# Patient Record
Sex: Female | Born: 1985 | Race: Black or African American | Hispanic: No | Marital: Married | Smoking: Never smoker
Health system: Southern US, Community
[De-identification: ages and names within clinical notes are randomized; demographics above are authoritative.]

## PROBLEM LIST (undated history)

## (undated) DIAGNOSIS — E282 Polycystic ovarian syndrome: Secondary | ICD-10-CM

## (undated) DIAGNOSIS — Z9889 Other specified postprocedural states: Secondary | ICD-10-CM

## (undated) HISTORY — DX: Other specified postprocedural states: Z98.890

## (undated) HISTORY — DX: Polycystic ovarian syndrome: E28.2

## (undated) HISTORY — PX: BREAST SURGERY: SHX581

## (undated) HISTORY — PX: AUGMENTATION MAMMAPLASTY: SUR837

---

## 2019-06-04 ENCOUNTER — Emergency Department (HOSPITAL_COMMUNITY)
Admission: EM | Admit: 2019-06-04 | Discharge: 2019-06-04 | Disposition: A | Discharge status: Home / Self Care | Payer: BC Managed Care – PPO | Attending: Emergency Medicine | Admitting: Emergency Medicine

## 2019-06-04 ENCOUNTER — Encounter (HOSPITAL_COMMUNITY): Payer: Self-pay | Admitting: Emergency Medicine

## 2019-06-04 ENCOUNTER — Other Ambulatory Visit: Payer: Self-pay

## 2019-06-04 DIAGNOSIS — E876 Hypokalemia: Secondary | ICD-10-CM | POA: Insufficient documentation

## 2019-06-04 DIAGNOSIS — R1030 Lower abdominal pain, unspecified: Secondary | ICD-10-CM | POA: Diagnosis not present

## 2019-06-04 DIAGNOSIS — Z3A14 14 weeks gestation of pregnancy: Secondary | ICD-10-CM | POA: Diagnosis not present

## 2019-06-04 DIAGNOSIS — R109 Unspecified abdominal pain: Secondary | ICD-10-CM

## 2019-06-04 DIAGNOSIS — O26892 Other specified pregnancy related conditions, second trimester: Secondary | ICD-10-CM | POA: Diagnosis not present

## 2019-06-04 LAB — CBC
HCT: 37.3 % (ref 36.0–46.0)
Hemoglobin: 12.3 g/dL (ref 12.0–15.0)
MCH: 29.8 pg (ref 26.0–34.0)
MCHC: 33 g/dL (ref 30.0–36.0)
MCV: 90.3 fL (ref 80.0–100.0)
Platelets: 358 10*3/uL (ref 150–400)
RBC: 4.13 MIL/uL (ref 3.87–5.11)
RDW: 12.4 % (ref 11.5–15.5)
WBC: 10.5 10*3/uL (ref 4.0–10.5)
nRBC: 0 % (ref 0.0–0.2)

## 2019-06-04 LAB — COMPREHENSIVE METABOLIC PANEL
ALT: 10 U/L (ref 0–44)
AST: 15 U/L (ref 15–41)
Albumin: 3.6 g/dL (ref 3.5–5.0)
Alkaline Phosphatase: 66 U/L (ref 38–126)
Anion gap: 8 (ref 5–15)
BUN: 10 mg/dL (ref 6–20)
CO2: 24 mmol/L (ref 22–32)
Calcium: 9.1 mg/dL (ref 8.9–10.3)
Chloride: 103 mmol/L (ref 98–111)
Creatinine, Ser: 0.69 mg/dL (ref 0.44–1.00)
GFR calc Af Amer: >60 mL/min (ref >60)
GFR calc non Af Amer: >60 mL/min (ref >60)
Glucose, Bld: 90 mg/dL (ref 70–99)
Potassium: 3.4 mmol/L — ABNORMAL LOW (ref 3.5–5.1)
Sodium: 135 mmol/L (ref 135–145)
Total Bilirubin: 0.3 mg/dL (ref 0.3–1.2)
Total Protein: 7.5 g/dL (ref 6.5–8.1)

## 2019-06-04 LAB — URINALYSIS, ROUTINE W REFLEX MICROSCOPIC
Bilirubin Urine: NEGATIVE
Glucose, UA: NEGATIVE mg/dL
Hgb urine dipstick: NEGATIVE
Ketones, ur: NEGATIVE mg/dL
Leukocytes,Ua: NEGATIVE
Nitrite: NEGATIVE
Protein, ur: NEGATIVE mg/dL
Specific Gravity, Urine: 1.025 (ref 1.005–1.030)
pH: 6 (ref 5.0–8.0)

## 2019-06-04 LAB — LIPASE, BLOOD: Lipase: 28 U/L (ref 11–51)

## 2019-06-04 LAB — HCG, QUANTITATIVE, PREGNANCY: hCG, Beta Chain, Quant, S: 59436 m[IU]/mL — ABNORMAL HIGH (ref <5)

## 2019-06-04 MED ORDER — POTASSIUM CHLORIDE CRYS ER 20 MEQ PO TBCR
40.0000 meq | EXTENDED_RELEASE_TABLET | Freq: Once | ORAL | Status: AC
  Administered 2019-06-04: 19:00:00 40 meq via ORAL
  Filled 2019-06-04: qty 2

## 2019-06-04 NOTE — ED Provider Notes (Signed)
[facility] DEPT Provider Note   CSN: [id number] Arrival date & time: 06/04/19  1652    History   Chief Complaint Chief Complaint  Patient presents with   Abdominal Pain    HPI Angelica Farmer is a 33 y.o. G4P3 approximately [redacted] weeks gestation pregnant female presenting for evaluation of acute onset, persistent lower abdominal pressure since this morning.  She reports mild pressure sensation which she reports is "not really pain "to the suprapubic region but radiating bilaterally.  Pain worsens with ambulation, improves with laying on her right side.  She denies urinary symptoms, vaginal itching, bleeding, discharge, diarrhea, constipation, nausea, vomiting, fever, shortness of breath, chest pain.  No known sick contacts, no suspicious food intake.  Has not tried anything for her symptoms.  She recently relocated here from [place] and reports she has an appointment to see an OB/GYN here next month.  She does report that she had an ultrasound a few weeks ago which showed an IUP and a right sided ovarian cyst of unknown size to the patient which the patient states she was told would resolve on its own.     The history is provided by the patient.    No past medical history on file.  There are no active problems to display for this patient.     OB History    Gravida  4   Para   3   Term   3   Preterm      AB      Living   3     SAB      TAB      Ectopic      Multiple      Live Births               Home Medications    Prior to Admission medications   Not on File    Family History No family history on file.  Social History Social History   Tobacco Use   Smoking status: Not on file  Substance Use Topics   Alcohol use: Not on file   Drug use: Not on file     Allergies   Patient has no known allergies.   Review of Systems Review of Systems  Constitutional: Negative for chills and fever.  Respiratory:  Negative for shortness of breath.   Cardiovascular: Negative for chest pain.  Gastrointestinal: Positive for abdominal pain. Negative for nausea and vomiting.  Genitourinary: Positive for pelvic pain. Negative for decreased urine volume, dysuria, frequency, hematuria, urgency, vaginal bleeding, vaginal discharge and vaginal pain.     Physical Exam Updated Vital Signs BP 124/80 (BP Location: Left Arm)    Pulse 83    Temp 98.9 F (37.2 C) (Oral)    Resp 16    Ht 5\' 4"  (1.626 m)    Wt 71.2 kg    LMP 02/25/2019    SpO2 98%    BMI 26.95 kg/m   Physical Exam Vitals signs and nursing note reviewed.  Constitutional:      General: She is not in acute distress.    Appearance: She is well-developed.     Comments: Resting comfortably in bed  HENT:     Head: Normocephalic and atraumatic.  Eyes:     General:        Right eye: No discharge.        Left eye: No discharge.     Conjunctiva/sclera: Conjunctivae normal.  Neck:  Vascular: No JVD.     Trachea: No tracheal deviation.  Cardiovascular:     Rate and Rhythm: Normal rate and regular rhythm.  Pulmonary:     Effort: Pulmonary effort is normal.     Breath sounds: Normal breath sounds.  Abdominal:     General: Abdomen is flat. Bowel sounds are normal. There is no distension.     Palpations: Abdomen is soft.     Tenderness: There is abdominal tenderness in the suprapubic area. There is no right CVA tenderness, left CVA tenderness, guarding or rebound. Negative signs include Murphy's sign and Rovsing's sign.     Comments: Uterine fundus past the pelvic brim by ~2cm  Genitourinary:    Comments: deferred Skin:    General: Skin is warm and dry.     Findings: No erythema.  Neurological:     Mental Status: She is alert.  Psychiatric:        Behavior: Behavior normal.      ED Treatments / Results  Labs (all labs ordered are listed, but only abnormal results are displayed) Labs Reviewed  COMPREHENSIVE METABOLIC PANEL - Abnormal;  Notable for the following components:      Result Value   Potassium 3.4 (*)    All other components within normal limits  HCG, QUANTITATIVE, PREGNANCY - Abnormal; Notable for the following components:   hCG, Beta Chain, Quant, S [id number] (*)    All other components within normal limits  LIPASE, BLOOD  CBC  URINALYSIS, ROUTINE W REFLEX MICROSCOPIC    EKG None  Radiology No results found.  Procedures Procedures (including critical care time)  Medications Ordered in ED Medications - No data to display   Initial Impression / Assessment and Plan / ED Course  I have reviewed the triage vital signs and the nursing notes.  Pertinent labs & imaging results that were available during my care of the patient were reviewed by me and considered in my medical decision making (see chart for details).        Patient with reported IUP (per the patient) approximately [redacted] weeks along presents for evaluation of mild pelvic pain.  Patient is afebrile, vital signs are stable.  She is nontoxic in appearance.  Abdominal examination is reassuring with no peritoneal signs.  She has suprapubic pain but her UA is unremarkable.  The remainder of her blood work reviewed by me shows no leukocytosis, no anemia, no metabolic derangements, no renal insufficiency.  She is very mildly hypokalemic, replenished orally in the ED.  She has no other associated symptoms.  We had a long discussion regarding the utility of a pelvic ultrasound in the ED and a shared decision-making conversation was had.  The patient and her significant other would like to hold off on any additional imaging at this time which I think is reasonable given she is overall very well-appearing, has no concerning signs or symptoms of placenta previa/abruptio, preeclampsia, PID, ovarian torsion, or ectopic pregnancy.  She has no unilateral abdominal tenderness or pain on examination which is also reassuring and I doubt that her ovarian cyst on prior  ultrasound is contributing to her symptoms as these do generally resolve on their own over the course of a couple of months and she has no unilateral pain.  Her quantitative hCG is appropriately elevated.  He does have follow-up with an OB/GYN scheduled here.  Consider the possibility of round ligament syndrome as well.  Recommend Tylenol, application of heat as needed for pain.  Recommend  close OB/GYN follow-up.  We discussed that [facility] if she has any concerning signs or symptoms and we discussed strict ED return precautions.  Patient and her significant other verbalized understanding of and agreement with plan and patient stable for discharge home at this time. Discussed with Dr. Rodena MedinMessick who agrees with assessment and plan at this time.    Final Clinical Impressions(s) / ED Diagnoses   Final diagnoses:  Abdominal pain during pregnancy in second trimester    ED Discharge Orders    None       Bennye AlmFawze, Lamont Glasscock A, PA-C 06/04/19 1902    Wynetta FinesMessick, Peter C, MD 06/05/19 1447

## 2019-06-04 NOTE — ED Triage Notes (Signed)
Pt c/op lower abd pressure since last night. Denies n/v/d or urinary problems.

## 2019-06-04 NOTE — Discharge Instructions (Signed)
You can take 1 to 2 tablets of Tylenol every 6 hours as needed for pain.  Do not exceed 4000 mg of Tylenol daily.  You can apply heating pad 20 minutes at a time a few times a day for pain or take warm soaks.  See attached information for pain management and other recommendations to help ease the pressure in your abdomen.  The rest of your blood work was very reassuring.  Follow-up with your OB/GYN as scheduled or you can call to move your appointment up.  If you have any concerning signs or symptoms such as worsening pain, persistent vomiting, high fevers, vaginal bleeding then go to the [facility] where there are OB/GYN specialist that can better evaluate you.

## 2019-11-11 ENCOUNTER — Emergency Department (HOSPITAL_COMMUNITY)
Admission: EM | Admit: 2019-11-11 | Discharge: 2019-11-12 | Disposition: A | Discharge status: Home / Self Care | Payer: Self-pay | Attending: Emergency Medicine | Admitting: Emergency Medicine

## 2019-11-11 ENCOUNTER — Encounter (HOSPITAL_COMMUNITY): Payer: Self-pay

## 2019-11-11 ENCOUNTER — Other Ambulatory Visit: Payer: Self-pay

## 2019-11-11 DIAGNOSIS — Z3A01 Less than 8 weeks gestation of pregnancy: Secondary | ICD-10-CM | POA: Insufficient documentation

## 2019-11-11 DIAGNOSIS — Z3490 Encounter for supervision of normal pregnancy, unspecified, unspecified trimester: Secondary | ICD-10-CM

## 2019-11-11 DIAGNOSIS — Z79899 Other long term (current) drug therapy: Secondary | ICD-10-CM | POA: Insufficient documentation

## 2019-11-11 DIAGNOSIS — O3481 Maternal care for other abnormalities of pelvic organs, first trimester: Secondary | ICD-10-CM | POA: Insufficient documentation

## 2019-11-11 DIAGNOSIS — N83201 Unspecified ovarian cyst, right side: Secondary | ICD-10-CM | POA: Insufficient documentation

## 2019-11-11 DIAGNOSIS — O4691 Antepartum hemorrhage, unspecified, first trimester: Secondary | ICD-10-CM | POA: Insufficient documentation

## 2019-11-11 DIAGNOSIS — N939 Abnormal uterine and vaginal bleeding, unspecified: Secondary | ICD-10-CM

## 2019-11-11 LAB — WET PREP, GENITAL
Clue Cells Wet Prep HPF POC: NONE SEEN
Sperm: NONE SEEN
Trich, Wet Prep: NONE SEEN
Yeast Wet Prep HPF POC: NONE SEEN

## 2019-11-11 LAB — PREGNANCY, URINE: Preg Test, Ur: POSITIVE — AB

## 2019-11-11 NOTE — ED Triage Notes (Signed)
Patient arrived stating she found out she was pregnant last week. Reports that she began spotting today. Reports irregular periods and unknown how far along she is. Denies any significant bleeding, clots, or cramping.
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
## 2019-11-12 ENCOUNTER — Emergency Department (HOSPITAL_COMMUNITY): Payer: Self-pay

## 2019-11-12 LAB — HCG, QUANTITATIVE, PREGNANCY: hCG, Beta Chain, Quant, S: 7863 m[IU]/mL — ABNORMAL HIGH (ref <5)

## 2019-11-12 LAB — ABO/RH: ABO/RH(D): A POS

## 2019-11-12 NOTE — ED Provider Notes (Signed)
2:10 AM Patient's ultrasound reviewed.  This shows single viable IUP of approximately [redacted]w[redacted]d.  Fetal heart rate 109bpm.  Complex R ovarian cyst also noted, though patient without c/o abdominal pain.  Stable for follow up with outpatient OB for ongoing prenatal care. Will refer to [facility]. Return precautions discussed and provided. Patient discharged in stable condition with no unaddressed concerns.   Results for orders placed or performed during the hospital encounter of 11/11/19  Wet prep, genital  Result Value Ref Range   Yeast Wet Prep HPF POC NONE SEEN NONE SEEN   Trich, Wet Prep NONE SEEN NONE SEEN   Clue Cells Wet Prep HPF POC NONE SEEN NONE SEEN   WBC, Wet Prep HPF POC FEW (A) NONE SEEN   Sperm NONE SEEN   Pregnancy, urine  Result Value Ref Range   Preg Test, Ur POSITIVE (A) NEGATIVE  hCG, quantitative, pregnancy  Result Value Ref Range   hCG, Beta Chain, Quant, S 7,863 (H) <5 mIU/mL  ABO/Rh  Result Value Ref Range   ABO/RH(D)      A POS Performed at [facility], [address], [place], [place] [address]    US OB LESS THAN 14 WEEKS WITH OB TRANSVAGINAL  Result Date: 11/12/2019 CLINICAL DATA:  Initial evaluation for acute vaginal bleeding, early pregnancy. EXAM: OBSTETRIC <14 WK [place] AND TRANSVAGINAL OB US TECHNIQUE: Both transabdominal and transvaginal ultrasound examinations were performed for complete evaluation of the gestation as well as the maternal uterus, adnexal regions, and pelvic cul-de-sac. Transvaginal technique was performed to assess early pregnancy. COMPARISON:  None. FINDINGS: Intrauterine gestational sac: Single Yolk sac:  Present Embryo:  Present Cardiac Activity: Present Heart Rate: 109 bpm CRL: 2.6 mm   5 w   6 d                  [place] EDC: 07/09/2020 Subchorionic hemorrhage:  None visualized. Maternal uterus/adnexae: Left ovary within normal limits. 3.4 x 3.4 x 4.7 cm complex cyst seen within the right ovary, likely a hemorrhagic cyst with  internal retractile clot. No internal vascularity. No free fluid within the pelvis. IMPRESSION: 1. Single viable intrauterine pregnancy as above, estimated gestational age [redacted] weeks and 6 days by crown-rump length, with ultrasound EDC of 07/09/2020. No complication. 2. 4.7 cm complex right ovarian cyst, likely a hemorrhagic cyst with internal retractile clot. Attention at follow-up recommended. 3. No other acute maternal uterine or adnexal abnormality identified. Electronically Signed   By: Rise Mu M.D.   On: 11/12/2019 01:05      Antony Madura, Cordelia Poche 11/12/19 Otelia Santee, April, MD 11/12/19 0350

## 2019-11-12 NOTE — Discharge Instructions (Signed)
Your ultrasound revealed an intrauterine pregnancy of approximately 5 weeks, 6 days.  If you have not already, start taking a prenatal vitamin daily.  Call and schedule routine prenatal follow-up with an OB/GYN.  Avoid NSAIDs such as ibuprofen or Aleve in pregnancy.  You may take Tylenol for any discomfort.  Should you develop any new or concerning symptoms associated with pregnancy, present to [facility] for further evaluation.

## 2019-12-30 ENCOUNTER — Telehealth: Payer: Self-pay | Admitting: Family Medicine

## 2019-12-30 NOTE — Telephone Encounter (Signed)
Attempted to contact patient to get her scheduled for her new ob intake appointment. No answer, left voicemail w/ appointment information 01/27/20 @ 3:30. Patient instructed that this appointment is a telephone visit. Patient instructed to give the office a call back if she needs to reschedule the appointment. MyChart message sent

## 2020-01-27 ENCOUNTER — Ambulatory Visit (INDEPENDENT_AMBULATORY_CARE_PROVIDER_SITE_OTHER): Payer: Medicaid Other | Admitting: *Deleted

## 2020-01-27 ENCOUNTER — Other Ambulatory Visit: Payer: Self-pay

## 2020-01-27 DIAGNOSIS — Z3480 Encounter for supervision of other normal pregnancy, unspecified trimester: Secondary | ICD-10-CM

## 2020-01-27 NOTE — Progress Notes (Signed)
3:30 I called Choua for her telephone visit for her new ob intake.  I left a message I was calling for her telephone visit and will call again in a few minutes; please be available to answer so we can complete your visit. Nashalie Sallis,RN  3:37 I called Jasiya again and left a message I am calling again for your telephone visit and since I did not reach you- it will need to be rescheduled. Please call our office to reschedule. I will try your contact number ; but if I do not reach you- please call our office to reschedule your telephone visit. I called her contact number and asked for Daniyah - she was not there .  I asked if they would give her the message that we were trying to reach her about her appointment and to call us. A female took the message and phone number for Kynsleigh to call us back. Shahrzad Koble,RN

## 2020-01-28 ENCOUNTER — Encounter: Payer: Self-pay | Admitting: Nurse Practitioner

## 2020-02-17 ENCOUNTER — Other Ambulatory Visit: Payer: Self-pay

## 2020-02-19 ENCOUNTER — Ambulatory Visit (INDEPENDENT_AMBULATORY_CARE_PROVIDER_SITE_OTHER): Payer: Medicaid Other | Admitting: *Deleted

## 2020-02-19 DIAGNOSIS — Z3490 Encounter for supervision of normal pregnancy, unspecified, unspecified trimester: Secondary | ICD-10-CM

## 2020-02-19 NOTE — Progress Notes (Signed)
I connected with  Zamirah Deroos on 02/19/20 at  8:30 AM EDT by telephone and verified that I am speaking with the correct person using two identifiers.   I discussed the limitations, risks, security and privacy concerns of performing an evaluation and management service by telephone and the availability of in person appointments. I also discussed with the patient that there may be a patient responsible charge related to this service. The patient expressed understanding and agreed to proceed.   She informed me she has started care with [facility] with Nigel Bridgeman, CNM.  She confirmed she would like her appointments cancelled with our office.  Aparna Vanderweele,RN 02/19/2020  8:39 AM

## 2020-02-20 ENCOUNTER — Encounter: Payer: Self-pay | Admitting: Obstetrics and Gynecology

## 2020-02-27 ENCOUNTER — Other Ambulatory Visit: Payer: Self-pay | Admitting: Obstetrics and Gynecology

## 2020-02-27 DIAGNOSIS — Z3A22 22 weeks gestation of pregnancy: Secondary | ICD-10-CM

## 2020-02-27 DIAGNOSIS — Z363 Encounter for antenatal screening for malformations: Secondary | ICD-10-CM

## 2020-02-27 DIAGNOSIS — O280 Abnormal hematological finding on antenatal screening of mother: Secondary | ICD-10-CM

## 2020-03-05 ENCOUNTER — Other Ambulatory Visit: Payer: Self-pay | Admitting: *Deleted

## 2020-03-05 ENCOUNTER — Ambulatory Visit: Payer: Medicaid Other | Attending: Obstetrics and Gynecology

## 2020-03-05 ENCOUNTER — Other Ambulatory Visit: Payer: Self-pay

## 2020-03-05 ENCOUNTER — Other Ambulatory Visit: Payer: Self-pay | Admitting: Obstetrics and Gynecology

## 2020-03-05 ENCOUNTER — Ambulatory Visit: Payer: Medicaid Other | Admitting: *Deleted

## 2020-03-05 ENCOUNTER — Ambulatory Visit: Payer: Self-pay

## 2020-03-05 ENCOUNTER — Ambulatory Visit (HOSPITAL_BASED_OUTPATIENT_CLINIC_OR_DEPARTMENT_OTHER): Payer: Medicaid Other | Admitting: Genetic Counselor

## 2020-03-05 VITALS — BP 111/70 | HR 87 | Wt 191.0 lb

## 2020-03-05 DIAGNOSIS — O09292 Supervision of pregnancy with other poor reproductive or obstetric history, second trimester: Secondary | ICD-10-CM

## 2020-03-05 DIAGNOSIS — O280 Abnormal hematological finding on antenatal screening of mother: Secondary | ICD-10-CM

## 2020-03-05 DIAGNOSIS — Z315 Encounter for genetic counseling: Secondary | ICD-10-CM | POA: Diagnosis not present

## 2020-03-05 DIAGNOSIS — Z363 Encounter for antenatal screening for malformations: Secondary | ICD-10-CM | POA: Diagnosis present

## 2020-03-05 DIAGNOSIS — O09212 Supervision of pregnancy with history of pre-term labor, second trimester: Secondary | ICD-10-CM | POA: Diagnosis not present

## 2020-03-05 DIAGNOSIS — O285 Abnormal chromosomal and genetic finding on antenatal screening of mother: Secondary | ICD-10-CM

## 2020-03-05 DIAGNOSIS — O321XX0 Maternal care for breech presentation, not applicable or unspecified: Secondary | ICD-10-CM

## 2020-03-05 DIAGNOSIS — Z3A22 22 weeks gestation of pregnancy: Secondary | ICD-10-CM

## 2020-03-05 DIAGNOSIS — O0990 Supervision of high risk pregnancy, unspecified, unspecified trimester: Secondary | ICD-10-CM | POA: Diagnosis present

## 2020-03-05 DIAGNOSIS — O09213 Supervision of pregnancy with history of pre-term labor, third trimester: Secondary | ICD-10-CM

## 2020-03-05 DIAGNOSIS — O34219 Maternal care for unspecified type scar from previous cesarean delivery: Secondary | ICD-10-CM | POA: Diagnosis not present
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
## 2020-03-17 ENCOUNTER — Ambulatory Visit: Payer: Medicaid Other | Attending: Obstetrics and Gynecology

## 2020-03-18 ENCOUNTER — Other Ambulatory Visit: Payer: Self-pay

## 2020-03-18 ENCOUNTER — Ambulatory Visit: Payer: Medicaid Other | Attending: Obstetrics and Gynecology

## 2020-03-18 ENCOUNTER — Other Ambulatory Visit: Payer: Medicaid Other

## 2020-03-18 ENCOUNTER — Other Ambulatory Visit: Payer: Self-pay | Admitting: Obstetrics and Gynecology

## 2020-03-18 ENCOUNTER — Ambulatory Visit: Payer: Medicaid Other | Admitting: *Deleted

## 2020-03-18 DIAGNOSIS — Z8759 Personal history of other complications of pregnancy, childbirth and the puerperium: Secondary | ICD-10-CM

## 2020-03-18 NOTE — Progress Notes (Signed)
d 

## 2020-03-27 ENCOUNTER — Telehealth: Payer: Self-pay | Admitting: Genetic Counselor

## 2020-03-27 NOTE — Telephone Encounter (Signed)
I received an email from Ms. Silvester yesterday inquiring if her repeat Panorama noninvasive prenatal screening (NIPS) results were available yet. Ms. Arai had a sample redrawn for Panorama NIPS on 03/18/20 as her initial results were high-risk for trisomy 51, trisomy 33, and triploidy due to low fetal fraction (see Genetic Counseling note from 5/20). I responded to Ms. Jumper's email yesterday informing her that her results were not yet available.  This morning I heard from representative Mardella Layman from [place] that Ms. Abernathy's results were returned and are low-risk. I attempted to call Ms. Robinson, but there was no answer and her voicemail was full so I could not leave a message. Instead, I emailed Ms. Preis to discuss her negative NIPS result.  Ms. Mcduffey's negative results demonstrated an expected representation of chromosome 68, 31, 44, and sex chromosome material, greatly reducing the likelihood of trisomies 42, 76, or 68, triploidy, and sex chromosome aneuploidies for the pregnancy. I reminded Ms. Dipiero that NIPS is considered to be highly specific and sensitive, but is not considered to be a diagnostic test. Panorama NIPS identifies 94-99% of pregnancies with trisomies 21, 13, and 18, triploidy, and sex chromosome abnormalities, but does not test for all genetic conditions. Ms. White was reminded that diagnostic testing via amniocentesis is available should she be interested in confirming this result. I encouraged Ms. Tetro to contact me via phone or email if she has any questions about her results.  Gershon Crane, MS, [facility] Genetic Counselor

## 2020-04-16 ENCOUNTER — Ambulatory Visit: Payer: Medicaid Other | Admitting: *Deleted

## 2020-04-16 ENCOUNTER — Ambulatory Visit: Payer: Medicaid Other | Attending: Obstetrics and Gynecology

## 2020-04-16 ENCOUNTER — Other Ambulatory Visit: Payer: Self-pay

## 2020-04-16 VITALS — BP 119/78 | HR 124

## 2020-04-16 DIAGNOSIS — O09213 Supervision of pregnancy with history of pre-term labor, third trimester: Secondary | ICD-10-CM | POA: Diagnosis present

## 2020-04-16 DIAGNOSIS — O4403 Placenta previa specified as without hemorrhage, third trimester: Secondary | ICD-10-CM

## 2020-04-16 DIAGNOSIS — Z3A28 28 weeks gestation of pregnancy: Secondary | ICD-10-CM

## 2020-04-16 DIAGNOSIS — O0990 Supervision of high risk pregnancy, unspecified, unspecified trimester: Secondary | ICD-10-CM

## 2020-04-16 DIAGNOSIS — O09293 Supervision of pregnancy with other poor reproductive or obstetric history, third trimester: Secondary | ICD-10-CM

## 2020-04-16 DIAGNOSIS — O34219 Maternal care for unspecified type scar from previous cesarean delivery: Secondary | ICD-10-CM | POA: Diagnosis not present

## 2020-04-16 DIAGNOSIS — O289 Unspecified abnormal findings on antenatal screening of mother: Secondary | ICD-10-CM | POA: Diagnosis not present

## 2020-04-29 ENCOUNTER — Encounter (HOSPITAL_COMMUNITY): Payer: Self-pay | Admitting: Obstetrics and Gynecology

## 2020-04-29 ENCOUNTER — Other Ambulatory Visit: Payer: Self-pay

## 2020-04-29 ENCOUNTER — Inpatient Hospital Stay (HOSPITAL_COMMUNITY)
Admission: EM | Admit: 2020-04-29 | Discharge: 2020-04-29 | Disposition: A | Discharge status: Home / Self Care | Payer: Medicaid Other | Attending: Obstetrics and Gynecology | Admitting: Obstetrics and Gynecology

## 2020-04-29 DIAGNOSIS — O4403 Placenta previa specified as without hemorrhage, third trimester: Secondary | ICD-10-CM | POA: Insufficient documentation

## 2020-04-29 DIAGNOSIS — Z3A30 30 weeks gestation of pregnancy: Secondary | ICD-10-CM | POA: Insufficient documentation

## 2020-04-29 DIAGNOSIS — O99283 Endocrine, nutritional and metabolic diseases complicating pregnancy, third trimester: Secondary | ICD-10-CM | POA: Diagnosis not present

## 2020-04-29 DIAGNOSIS — O368130 Decreased fetal movements, third trimester, not applicable or unspecified: Secondary | ICD-10-CM

## 2020-04-29 DIAGNOSIS — O368131 Decreased fetal movements, third trimester, fetus 1: Secondary | ICD-10-CM | POA: Diagnosis not present

## 2020-04-29 DIAGNOSIS — O368191 Decreased fetal movements, unspecified trimester, fetus 1: Secondary | ICD-10-CM

## 2020-04-29 DIAGNOSIS — O30003 Twin pregnancy, unspecified number of placenta and unspecified number of amniotic sacs, third trimester: Secondary | ICD-10-CM | POA: Diagnosis not present

## 2020-04-29 DIAGNOSIS — E282 Polycystic ovarian syndrome: Secondary | ICD-10-CM | POA: Diagnosis not present

## 2020-04-29 DIAGNOSIS — Z3689 Encounter for other specified antenatal screening: Secondary | ICD-10-CM

## 2020-04-29 NOTE — Discharge Instructions (Signed)
Fetal Movement Counts Patient Name: ________________________________________________ Patient Due Date: ____________________ What is a fetal movement count?  A fetal movement count is the number of times that you feel your baby move during a certain amount of time. This may also be called a fetal kick count. A fetal movement count is recommended for every pregnant woman. You may be asked to start counting fetal movements as early as week 28 of your pregnancy. Pay attention to when your baby is most active. You may notice your baby's sleep and wake cycles. You may also notice things that make your baby move more. You should do a fetal movement count:  When your baby is normally most active.  At the same time each day. A good time to count movements is while you are resting, after having something to eat and drink. How do I count fetal movements? 1. Find a quiet, comfortable area. Sit, or lie down on your side. 2. Write down the date, the start time and stop time, and the number of movements that you felt between those two times. Take this information with you to your health care visits. 3. Write down your start time when you feel the first movement. 4. Count kicks, flutters, swishes, rolls, and jabs. You should feel at least 10 movements. 5. You may stop counting after you have felt 10 movements, or if you have been counting for 2 hours. Write down the stop time. 6. If you do not feel 10 movements in 2 hours, contact your health care provider for further instructions. Your health care provider may want to do additional tests to assess your baby's well-being. Contact a health care provider if:  You feel fewer than 10 movements in 2 hours.  Your baby is not moving like he or she usually does. Date: ____________ Start time: ____________ Stop time: ____________ Movements: ____________ Date: ____________ Start time: ____________ Stop time: ____________ Movements: ____________ Date: ____________  Start time: ____________ Stop time: ____________ Movements: ____________ Date: ____________ Start time: ____________ Stop time: ____________ Movements: ____________ Date: ____________ Start time: ____________ Stop time: ____________ Movements: ____________ Date: ____________ Start time: ____________ Stop time: ____________ Movements: ____________ Date: ____________ Start time: ____________ Stop time: ____________ Movements: ____________ Date: ____________ Start time: ____________ Stop time: ____________ Movements: ____________ Date: ____________ Start time: ____________ Stop time: ____________ Movements: ____________ This information is not intended to replace advice given to you by your health care provider. Make sure you discuss any questions you have with your health care provider. Document Revised: 05/23/2019 Document Reviewed: 05/23/2019 Elsevier Patient Education  2020 Elsevier Inc.  

## 2020-04-29 NOTE — ED Notes (Signed)
PA henderly saw patient and cleared for MAU. Report given to [facility], RN in MAU. Transport called for transfer.

## 2020-04-29 NOTE — MAU Provider Note (Signed)
Patient Angelica Farmer is a 34 y.o.  [phone]  at [redacted]w[redacted]d here with complaints of decreased fetal movements since 6 am. She denies vaginal bleeding, LOF. She endorses contractions that feel like Angelica Farmer. She is being followed for placenta previa (possible previa, per patient) but denies other medical problems.  History     CSN: [id number]  Arrival date and time: 04/29/20 1005   None     Chief Complaint  Patient presents with  . Decreased Fetal Movement   HPI Patient reports that she got up to use the bathroom this morning at 6 am and felt movement. Then, she went back to sleep and when she woke up at 8 she didn't feel any movements. She had orange juice and laid back down for 10 minutes, and then when she didn't feel movement she came to the [facility]. They transferred her to MAU.  OB History    Gravida  5   Para  4   Term  2   Preterm  2   AB      Living  3     SAB      TAB      Ectopic      Multiple      Live Births  3           Past Medical History:  Diagnosis Date  . Hx of abdominoplasty   . PCOS (polycystic ovarian syndrome)     Past Surgical History:  Procedure Laterality Date  . BREAST SURGERY    . CESAREAN SECTION      History reviewed. No pertinent family history.  Social History   Tobacco Use  . Smoking status: Never Smoker  . Smokeless tobacco: Never Used  Vaping Use  . Vaping Use: Never used  Substance Use Topics  . Alcohol use: Not Currently  . Drug use: Never    Allergies: No Known Allergies  Medications Prior to Admission  Medication Sig Dispense Refill Last Dose  . Ferrous Sulfate (IRON PO) Take by mouth.   04/28/2020 at Unknown time  . Prenatal Vit-Fe Fumarate-FA (MULTIVITAMIN-PRENATAL) 27-0.8 MG TABS tablet Take 1 tablet by mouth daily at 12 noon.   04/28/2020 at Unknown time    Review of Systems  Constitutional: Negative.   HENT: Negative.   Respiratory: Negative.   Cardiovascular: Negative.   Gastrointestinal:  Negative.   Genitourinary: Negative.   Musculoskeletal: Negative.   Neurological: Negative.    Physical Exam   Blood pressure 121/77, pulse (!) 110, temperature 98.3 F (36.8 C), temperature source Oral, resp. rate 18, height 5\' 4"  (1.626 m), weight 90.6 kg, last menstrual period 09/30/2019, SpO2 100 %.  Physical Exam HENT:     Head: Normocephalic.  Cardiovascular:     Rate and Rhythm: Normal rate.  Pulmonary:     Effort: Pulmonary effort is normal.  Abdominal:     General: Abdomen is flat.  Genitourinary:    General: Normal vulva.  Neurological:     General: No focal deficit present.     Mental Status: She is alert.  Psychiatric:        Mood and Affect: Mood normal.     MAU Course  Procedures  MDM NST: 135 bpm, mod var, present acel, no decels,no contractions.  Patient feels strong fetal movements while in MAU.   Assessment and Plan   1. Decreased fetal movement during pregnancy, antepartum, fetus 1 of multiple gestation   2. NST (non-stress test) reactive    2.  Patient feels reassured after NST; reviewed fetal movements and kick counts.   3. Keep appt at [facility] in two weeks; all questions answered.     Angelica Farmer 04/29/2020, 11:12 AM

## 2020-04-29 NOTE — ED Provider Notes (Signed)
[facility] EMERGENCY DEPARTMENT Provider Note   CSN: [id number] Arrival date & time: 04/29/20  1005    History Abd pain, Pregnant  Angelica Farmer is a 34 y.o. female with past medical history significant for PCOS who presents for evaluation of gross fetal movement.  Approximately 30 weeks 0 days.  She is followed by [facility].  States she felt baby move at 6 AM this morning.  Has not felt baby move since.  She has tried resting, drinking juice.  No fever, chills, nausea, vomiting, abdominal pain, pelvic pain, vaginal discharge, fluid leakage, vaginal bleeding.  No lower back pain.  No recent sexual activity.  No current pain.  Denies additional aggravating relieving factors.  History obtained from patient and past medical records.  No interpreter used.   HPI     Past Medical History:  Diagnosis Date  . Hx of abdominoplasty   . PCOS (polycystic ovarian syndrome)     There are no problems to display for this patient.   Past Surgical History:  Procedure Laterality Date  . BREAST SURGERY    . CESAREAN SECTION       OB History    Gravida  5   Para  4   Term  2   Preterm  2   AB      Living  3     SAB      TAB      Ectopic      Multiple      Live Births  3           History reviewed. No pertinent family history.  Social History   Tobacco Use  . Smoking status: Never Smoker  . Smokeless tobacco: Never Used  Vaping Use  . Vaping Use: Never used  Substance Use Topics  . Alcohol use: Not Currently  . Drug use: Never    Home Medications Prior to Admission medications   Medication Sig Start Date End Date Taking? Authorizing Provider  Ferrous Sulfate (IRON PO) Take by mouth.   Yes [provider]  Prenatal Vit-Fe Fumarate-FA (MULTIVITAMIN-PRENATAL) 27-0.8 MG TABS tablet Take 1 tablet by mouth daily at 12 noon.   Yes [provider]    Allergies    Patient has no known allergies.  Review of  Systems   Review of Systems  Constitutional: Negative.   HENT: Negative.   Respiratory: Negative.   Cardiovascular: Negative.   Gastrointestinal: Negative.   Genitourinary: Negative.   Musculoskeletal: Negative.   Neurological: Negative.   All other systems reviewed and are negative.   Physical Exam Updated Vital Signs BP 121/77 (BP Location: Right Arm)   Pulse (!) 110   Temp 98.3 F (36.8 C) (Oral)   Resp 18   Ht 5\' 4"  (1.626 m)   Wt 90.6 kg   LMP 09/30/2019   SpO2 100%   BMI 34.28 kg/m   Physical Exam Vitals and nursing note reviewed.  Constitutional:      General: She is not in acute distress.    Appearance: She is well-developed. She is not ill-appearing or toxic-appearing.  HENT:     Head: Atraumatic.  Eyes:     Pupils: Pupils are equal, round, and reactive to light.  Cardiovascular:     Rate and Rhythm: Normal rate.  Pulmonary:     Effort: No respiratory distress.  Abdominal:     General: There is no distension.     Comments:  Gravid abdomen above  umbilicus. Non tender  Musculoskeletal:        General: Normal range of motion.     Cervical back: Normal range of motion.  Skin:    General: Skin is warm and dry.  Neurological:     Mental Status: She is alert.     Comments: Ambulatory without difficulty    ED Results / Procedures / Treatments   Labs (all labs ordered are listed, but only abnormal results are displayed) Labs Reviewed - No data to display  EKG None  Radiology No results found.  Procedures Procedures (including critical care time)  Medications Ordered in ED Medications - No data to display  ED Course  I have reviewed the triage vital signs and the nursing notes.  Pertinent labs & imaging results that were available during my care of the patient were reviewed by me and considered in my medical decision making (see chart for details).  34 year old presents for evaluation of decreased fetal movement. [redacted]w[redacted]d. Followed by [facility]. Hx of late term miscarriage.  Patient does not appear in distress. Does not appear in active labor with imminent delivery of fetus. Abdomen gravid above umbilicus. Vital signs stable. Labs and imaging deferred to MAU provider.   CONSULT with APP MAU provider Melanie who agrees to accept patient in transfer for further workup up of decreased fetal movement in pregnancy.   MDM Rules/Calculators/A&P                           Final Clinical Impression(s) / ED Diagnoses Final diagnoses:  Decreased fetal movement during pregnancy, antepartum, fetus 1 of multiple gestation    Rx / DC Orders ED Discharge Orders    None       Thelma Lorenzetti A, PA-C 04/29/20 1103    Terald Sleeper, MD 04/29/20 1810

## 2020-04-29 NOTE — MAU Note (Signed)
Sent up from ER, did not know [facility] entrance.  Baby usually active in the morning, no movement this morning.  Denies pain, bleeding or leaking.  Felt a movement when reg at [facility] desk.  Pt and RN felt and heard movement when first listened in triage.

## 2020-05-19 ENCOUNTER — Other Ambulatory Visit: Payer: Self-pay | Admitting: Obstetrics and Gynecology

## 2020-05-29 DIAGNOSIS — O99019 Anemia complicating pregnancy, unspecified trimester: Secondary | ICD-10-CM | POA: Insufficient documentation

## 2020-05-29 DIAGNOSIS — O09899 Supervision of other high risk pregnancies, unspecified trimester: Secondary | ICD-10-CM | POA: Insufficient documentation

## 2020-05-29 DIAGNOSIS — Z98891 History of uterine scar from previous surgery: Secondary | ICD-10-CM | POA: Insufficient documentation

## 2020-05-29 DIAGNOSIS — O285 Abnormal chromosomal and genetic finding on antenatal screening of mother: Secondary | ICD-10-CM | POA: Insufficient documentation

## 2020-05-29 DIAGNOSIS — Z9889 Other specified postprocedural states: Secondary | ICD-10-CM | POA: Insufficient documentation

## 2020-05-29 DIAGNOSIS — Z8759 Personal history of other complications of pregnancy, childbirth and the puerperium: Secondary | ICD-10-CM | POA: Insufficient documentation

## 2020-06-24 NOTE — Patient Instructions (Signed)
Angelica Farmer  06/24/2020   Your procedure is scheduled on:  06/29/2020  Arrive at 1030 at Entrance C on [employer] at [facility]  and [employer]. You are invited to use the FREE valet parking or use the Visitor's parking deck.  Pick up the phone at the desk and dial [phone].  Call this number if you have problems the morning of surgery: [phone]  Remember:   Do not eat food:(After Midnight) Desps de medianoche.  Do not drink clear liquids: (After Midnight) Desps de medianoche.  Take these medicines the morning of surgery with A SIP OF WATER:  none   Do not wear jewelry, make-up or nail polish.  Do not wear lotions, powders, or perfumes. Do not wear deodorant.  Do not shave 48 hours prior to surgery.  Do not bring valuables to the hospital.  [facility] is not   responsible for any belongings or valuables brought to the hospital.  Contacts, dentures or bridgework may not be worn into surgery.  Leave suitcase in the car. After surgery it may be brought to your room.  For patients admitted to the hospital, checkout time is 11:00 AM the day of              discharge.      Please read over the following fact sheets that you were given:     Preparing for Surgery

## 2020-06-27 ENCOUNTER — Other Ambulatory Visit (HOSPITAL_COMMUNITY)
Admission: RE | Admit: 2020-06-27 | Discharge: 2020-06-27 | Disposition: A | Discharge status: Home / Self Care | Payer: Medicaid Other | Source: Ambulatory Visit | Attending: Obstetrics and Gynecology | Admitting: Obstetrics and Gynecology

## 2020-06-28 ENCOUNTER — Encounter (HOSPITAL_COMMUNITY): Payer: Self-pay | Admitting: Certified Registered Nurse Anesthetist

## 2020-06-29 ENCOUNTER — Encounter (HOSPITAL_COMMUNITY): Admission: RE | Payer: Self-pay | Source: Home / Self Care

## 2020-06-29 ENCOUNTER — Inpatient Hospital Stay (HOSPITAL_COMMUNITY)
Admission: RE | Admit: 2020-06-29 | Payer: Medicaid Other | Source: Home / Self Care | Admitting: Obstetrics and Gynecology

## 2020-06-29 SURGERY — Surgical Case
Anesthesia: Regional | Laterality: Bilateral

## 2020-07-10 ENCOUNTER — Other Ambulatory Visit: Payer: Medicaid Other
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
## 2021-02-23 ENCOUNTER — Other Ambulatory Visit: Payer: Self-pay

## 2021-02-23 DIAGNOSIS — I8393 Asymptomatic varicose veins of bilateral lower extremities: Secondary | ICD-10-CM

## 2021-03-03 ENCOUNTER — Encounter (HOSPITAL_COMMUNITY): Payer: Self-pay

## 2021-03-03 ENCOUNTER — Emergency Department (HOSPITAL_COMMUNITY)
Admission: EM | Admit: 2021-03-03 | Discharge: 2021-03-04 | Disposition: A | Discharge status: Home / Self Care | Payer: Medicaid Other | Attending: Emergency Medicine | Admitting: Emergency Medicine

## 2021-03-03 ENCOUNTER — Other Ambulatory Visit: Payer: Self-pay

## 2021-03-03 DIAGNOSIS — N939 Abnormal uterine and vaginal bleeding, unspecified: Secondary | ICD-10-CM | POA: Diagnosis present

## 2021-03-03 DIAGNOSIS — O021 Missed abortion: Secondary | ICD-10-CM | POA: Diagnosis not present

## 2021-03-03 DIAGNOSIS — O200 Threatened abortion: Secondary | ICD-10-CM

## 2021-03-03 NOTE — ED Triage Notes (Signed)
Pt states she took a pregnancy test 5/15 and it was positive. Pt states she had heavy bleeding today at approx 2100. Pt denied pain, just the heavy bleeding.

## 2021-03-03 NOTE — ED Provider Notes (Signed)
MSE was initiated and I personally evaluated the patient and placed orders (if any) at  11:04 PM on Mar 03, 2021.  Home preg test 4 days ago positive Took it because of breast tenderness LMP - March 13th, first since delivery 8 months ago Irregular since Today with vaginal bleeding, "gushed" then stopped  Today's Vitals   03/03/21 2245  BP: 137/80  Pulse: 89  Resp: 16  Temp: 98.8 F (37.1 C)  TempSrc: Oral  SpO2: 100%  Weight: 78.9 kg  Height: 5\' 4"  (1.626 m)   Body mass index is 29.87 kg/m.  Abd nontender Alert, no pallor.  The patient appears stable so that the remainder of the MSE may be completed by another provider.   , PA-C 03/03/21 2307    2308, MD 03/05/21 [phone]

## 2021-03-04 ENCOUNTER — Emergency Department (HOSPITAL_COMMUNITY): Payer: Medicaid Other

## 2021-03-04 LAB — BASIC METABOLIC PANEL
Anion gap: 6 (ref 5–15)
BUN: 11 mg/dL (ref 6–20)
CO2: 27 mmol/L (ref 22–32)
Calcium: 9.2 mg/dL (ref 8.9–10.3)
Chloride: 102 mmol/L (ref 98–111)
Creatinine, Ser: 0.8 mg/dL (ref 0.44–1.00)
GFR, Estimated: >60 mL/min (ref >60)
Glucose, Bld: 125 mg/dL — ABNORMAL HIGH (ref 70–99)
Potassium: 3 mmol/L — ABNORMAL LOW (ref 3.5–5.1)
Sodium: 135 mmol/L (ref 135–145)

## 2021-03-04 LAB — CBC WITH DIFFERENTIAL/PLATELET
Abs Immature Granulocytes: 0.03 10*3/uL (ref 0.00–0.07)
Basophils Absolute: 0.1 10*3/uL (ref 0.0–0.1)
Basophils Relative: 1 %
Eosinophils Absolute: 0.2 10*3/uL (ref 0.0–0.5)
Eosinophils Relative: 3 %
HCT: 38.1 % (ref 36.0–46.0)
Hemoglobin: 12.6 g/dL (ref 12.0–15.0)
Immature Granulocytes: 0 %
Lymphocytes Relative: 29 %
Lymphs Abs: 2.1 10*3/uL (ref 0.7–4.0)
MCH: 29 pg (ref 26.0–34.0)
MCHC: 33.1 g/dL (ref 30.0–36.0)
MCV: 87.8 fL (ref 80.0–100.0)
Monocytes Absolute: 0.7 10*3/uL (ref 0.1–1.0)
Monocytes Relative: 9 %
Neutro Abs: 4.3 10*3/uL (ref 1.7–7.7)
Neutrophils Relative %: 58 %
Platelets: 349 10*3/uL (ref 150–400)
RBC: 4.34 MIL/uL (ref 3.87–5.11)
RDW: 14.3 % (ref 11.5–15.5)
WBC: 7.4 10*3/uL (ref 4.0–10.5)
nRBC: 0 % (ref 0.0–0.2)

## 2021-03-04 LAB — HCG, QUANTITATIVE, PREGNANCY: hCG, Beta Chain, Quant, S: 15128 m[IU]/mL — ABNORMAL HIGH (ref <5)

## 2021-03-04 LAB — I-STAT BETA HCG BLOOD, ED (MC, WL, AP ONLY): I-stat hCG, quantitative: >2000 m[IU]/mL — ABNORMAL HIGH (ref <5)

## 2021-03-04 LAB — ABO/RH: ABO/RH(D): A POS

## 2021-03-04 NOTE — ED Provider Notes (Signed)
[facility]-EMERGENCY DEPT Provider Note   CSN: [id number] Arrival date & time: 03/03/21  2230     History Chief Complaint  Patient presents with  . Threatened Miscarriage    Angelica Farmer is a 35 y.o. female.  Patient to ED for evaluation of irregular vaginal bleeding. She reports delivery of a full term infant 8 months ago. She has not yet started regular menses. She reports in March she had a near normal period, then nothing until today when she passed a large volume of blood earlier in the evening that stopped shortly afterward. No abdominal pain or cramping. She reports taking a home pregnancy test that was positive.   The history is provided by the patient. No language interpreter was used.       Past Medical History:  Diagnosis Date  . Hx of abdominoplasty   . PCOS (polycystic ovarian syndrome)     Patient Active Problem List   Diagnosis Date Noted  . Abnormal chromosomal and genetic finding on antenatal screening mother 05/29/2020  . Antepartum anemia complicating pregnancy 05/29/2020  . History of abdominoplasty 05/29/2020  . History of placenta previa 05/29/2020  . Hx of cesarean section 05/29/2020  . Supervision of other high-risk pregnancy 05/29/2020    Past Surgical History:  Procedure Laterality Date  . BREAST SURGERY    . CESAREAN SECTION       OB History    Gravida  5   Para  4   Term  2   Preterm  2   AB      Living  3     SAB      IAB      Ectopic      Multiple      Live Births  3           History reviewed. No pertinent family history.  Social History   Tobacco Use  . Smoking status: Never Smoker  . Smokeless tobacco: Never Used  Vaping Use  . Vaping Use: Never used  Substance Use Topics  . Alcohol use: Not Currently  . Drug use: Never    Home Medications Prior to Admission medications   Medication Sig Start Date End Date Taking? Authorizing Provider  Ferrous Sulfate (IRON PO) Take 1  tablet by mouth daily.     [provider]  Prenatal Vit-Fe Fumarate-FA (MULTIVITAMIN-PRENATAL) 27-0.8 MG TABS tablet Take 1 tablet by mouth daily.     [provider]    Allergies    Patient has no known allergies.  Review of Systems   Review of Systems  Constitutional: Negative for fatigue.  Respiratory: Negative for shortness of breath.   Gastrointestinal: Negative for abdominal pain and nausea.  Genitourinary: Positive for vaginal bleeding.       Bilateral breast tenderness  Musculoskeletal: Negative for back pain.  Neurological: Negative for weakness.    Physical Exam Updated Vital Signs BP 123/66   Pulse 80   Temp 98.8 F (37.1 C) (Oral)   Resp 16   Ht 5\' 4"  (1.626 m)   Wt 78.9 kg   SpO2 100%   BMI 29.87 kg/m   Physical Exam Vitals and nursing note reviewed.  Constitutional:      Appearance: She is well-developed.  Pulmonary:     Effort: Pulmonary effort is normal.  Abdominal:     Palpations: Abdomen is soft.     Tenderness: There is no abdominal tenderness.  Musculoskeletal:  General: Normal range of motion.     Cervical back: Normal range of motion.  Skin:    General: Skin is warm and dry.  Neurological:     Mental Status: She is alert and oriented to person, place, and time.     ED Results / Procedures / Treatments   Labs (all labs ordered are listed, but only abnormal results are displayed) Labs Reviewed  I-STAT BETA HCG BLOOD, ED ([facility], [facility], [facility] ONLY) - Abnormal; Notable for the following components:      Result Value   I-stat hCG, quantitative >2,000.0 (*)    All other components within normal limits  CBC WITH DIFFERENTIAL/PLATELET  BASIC METABOLIC PANEL  HCG, QUANTITATIVE, PREGNANCY  ABO/RH   Results for orders placed or performed during the hospital encounter of 03/03/21  CBC with Differential  Result Value Ref Range   WBC 7.4 4.0 - 10.5 K/uL   RBC 4.34 3.87 - 5.11 MIL/uL   Hemoglobin 12.6 12.0 - 15.0 g/dL   HCT  62.9 47.6 - 54.6 %   MCV 87.8 80.0 - 100.0 fL   MCH 29.0 26.0 - 34.0 pg   MCHC 33.1 30.0 - 36.0 g/dL   RDW 50.3 54.6 - 56.8 %   Platelets 349 150 - 400 K/uL   nRBC 0.0 0.0 - 0.2 %   Neutrophils Relative % 58 %   Neutro Abs 4.3 1.7 - 7.7 K/uL   Lymphocytes Relative 29 %   Lymphs Abs 2.1 0.7 - 4.0 K/uL   Monocytes Relative 9 %   Monocytes Absolute 0.7 0.1 - 1.0 K/uL   Eosinophils Relative 3 %   Eosinophils Absolute 0.2 0.0 - 0.5 K/uL   Basophils Relative 1 %   Basophils Absolute 0.1 0.0 - 0.1 K/uL   Immature Granulocytes 0 %   Abs Immature Granulocytes 0.03 0.00 - 0.07 K/uL  Basic metabolic panel  Result Value Ref Range   Sodium 135 135 - 145 mmol/L   Potassium 3.0 (L) 3.5 - 5.1 mmol/L   Chloride 102 98 - 111 mmol/L   CO2 27 22 - 32 mmol/L   Glucose, Bld 125 (H) 70 - 99 mg/dL   BUN 11 6 - 20 mg/dL   Creatinine, Ser 1.27 0.44 - 1.00 mg/dL   Calcium 9.2 8.9 - 51.7 mg/dL   GFR, Estimated >00 >17 mL/min   Anion gap 6 5 - 15  hCG, quantitative, pregnancy  Result Value Ref Range   hCG, Beta Chain, Quant, S 15,128 (H) <5 mIU/mL  I-Stat beta hCG blood, ED  Result Value Ref Range   I-stat hCG, quantitative >2,000.0 (H) <5 mIU/mL   Comment 3          ABO/Rh  Result Value Ref Range   ABO/RH(D)      A POS Performed at [facility], [address], [place], [place] [address]      EKG None  Radiology No results found. US OB LESS THAN 14 WEEKS WITH OB TRANSVAGINAL  Result Date: 03/04/2021 CLINICAL DATA:  Threatened abortion, LMP 12/27/2020 EXAM: OBSTETRIC <14 WK [place] AND TRANSVAGINAL OB US TECHNIQUE: Both transabdominal and transvaginal ultrasound examinations were performed for complete evaluation of the gestation as well as the maternal uterus, adnexal regions, and pelvic cul-de-sac. Transvaginal technique was performed to assess early pregnancy. COMPARISON:  None. FINDINGS: Intrauterine gestational sac: Present, single, containing extensive relatively  homogeneous echogenic debris likely representing blood product Yolk sac:  Not identified Embryo:  Not identified Cardiac Activity:  Not applicable MSD: 27 mm (transvaginal imaging) =  7 w   5 d CRL: Not applicable Subchorionic hemorrhage:  None visualized. Maternal uterus/adnexae: The uterus is anteverted. No intrauterine masses are seen. The cervix is closed and is unremarkable. No free fluid is seen within the cul-de-sac. The maternal ovaries are normal in size and echogenicity. Echogenic 1 cm structure within the right ovary may represent a small dermoid cyst. Multiple follicles are seen within the ovaries bilaterally. IMPRESSION: Intrauterine gestational sac containing extensive echogenic debris likely representing blood product. No visualized fetal pole, abnormal given the gestational sac size. Together, the findings are in keeping with a missed abortion. Electronically Signed   By: Helyn Numbers MD   On: 03/04/2021 04:54    Procedures Procedures   Medications Ordered in ED Medications - No data to display  ED Course  I have reviewed the triage vital signs and the nursing notes.  Pertinent labs & imaging results that were available during my care of the patient were reviewed by me and considered in my medical decision making (see chart for details).    MDM Rules/Calculators/A&P                          Patient to ED with ss/sxs as per HPI.   She was initially seen and evaluated in triage, moved to an acute care room where quant Hcg of >15000 was discussed. Ultrasound was ordered. She denies any further bleeding. No pain.   Ultrasound was completed prior to pelvic exam. [place] c/w missed abortion. Discussed performing a pelvic exam but the patient denies and change of infection and declines. The results of the US/labs were discussed with her and all questions answered.   She has an already scheduled appointment with her OB/GYN ([facility]) and plans close follow up with her.   Final Clinical  Impression(s) / ED Diagnoses Final diagnoses:  None   1. Missed abortion  Rx / DC Orders ED Discharge Orders    None       Elpidio Anis, PA-C 03/04/21 0600    Dione Booze, MD 03/04/21 [phone]

## 2021-03-04 NOTE — ED Notes (Signed)
Patient is in bed. IV started. Patient is alert and oriented and pleasant

## 2021-03-04 NOTE — ED Notes (Signed)
Ultrasound at bedside

## 2021-03-04 NOTE — Discharge Instructions (Addendum)
Keep your scheduled appointment with OB/GYN for further evaluation and management of miscarriage.   If you develop any fever, severe pain, severe vaginal bleeding or new concern, please return to the ED for further treatment.

## 2021-03-18 ENCOUNTER — Encounter: Payer: Medicaid Other | Admitting: Vascular Surgery

## 2021-03-18 ENCOUNTER — Ambulatory Visit (HOSPITAL_COMMUNITY): Payer: Medicaid Other

## 2021-03-18 ENCOUNTER — Ambulatory Visit: Payer: Medicaid Other | Admitting: Medical

## 2021-03-22 ENCOUNTER — Ambulatory Visit (HOSPITAL_BASED_OUTPATIENT_CLINIC_OR_DEPARTMENT_OTHER): Payer: Medicaid Other | Admitting: Family Medicine

## 2021-06-23 ENCOUNTER — Encounter: Payer: Medicaid Other | Admitting: Vascular Surgery

## 2021-06-23 ENCOUNTER — Encounter (HOSPITAL_COMMUNITY): Payer: Medicaid Other

## 2021-06-24 ENCOUNTER — Encounter (HOSPITAL_COMMUNITY): Payer: Medicaid Other

## 2021-06-24 ENCOUNTER — Encounter: Payer: Medicaid Other | Admitting: Vascular Surgery

## 2021-07-22 ENCOUNTER — Encounter (HOSPITAL_COMMUNITY): Payer: Medicaid Other

## 2021-07-22 ENCOUNTER — Ambulatory Visit (INDEPENDENT_AMBULATORY_CARE_PROVIDER_SITE_OTHER): Payer: Medicaid Other | Admitting: Physician Assistant

## 2021-07-22 ENCOUNTER — Other Ambulatory Visit: Payer: Self-pay

## 2021-07-22 ENCOUNTER — Ambulatory Visit (HOSPITAL_COMMUNITY)
Admission: RE | Admit: 2021-07-22 | Discharge: 2021-07-22 | Disposition: A | Discharge status: Home / Self Care | Payer: Medicaid Other | Source: Ambulatory Visit | Attending: Vascular Surgery | Admitting: Vascular Surgery

## 2021-07-22 VITALS — BP 112/79 | Temp 97.5°F | Resp 16 | Ht 64.0 in | Wt 165.0 lb

## 2021-07-22 DIAGNOSIS — I8393 Asymptomatic varicose veins of bilateral lower extremities: Secondary | ICD-10-CM | POA: Diagnosis present

## 2021-07-22 DIAGNOSIS — I872 Venous insufficiency (chronic) (peripheral): Secondary | ICD-10-CM

## 2021-07-22 NOTE — Progress Notes (Signed)
[facility] [place]   Reason for referral: Swollen left leg  History of Present Illness  Angelica Farmer is a 35 y.o. female who presents with chief complaint: swollen leg.  Patient notes, onset of swelling >6 months ago, associated with prolonged sitting and standing.  The patient has had no history of DVT, Positive history of varicose vein, no history of venous stasis ulcers, no history of  Lymphedema and no history of skin changes in lower legs.  There is positive family history of venous disorders.  The patient has  used compression stockings in the past.  She states she was having problems 4 years ago on the right LE which resulted in laser ablation treatment.  The left LE has clusters of painful vein which get more swollen through out her day.  She denise weeping of fluid, non healing ulcer and no skin changes.  Her left leg feels achy and painful at night.  Past Medical History:  Diagnosis Date   Hx of abdominoplasty    PCOS (polycystic ovarian syndrome)     Past Surgical History:  Procedure Laterality Date   BREAST SURGERY     CESAREAN SECTION      Social History   Socioeconomic History   Marital status: Single    Spouse name: Not on file   Number of children: Not on file   Years of education: Not on file   Highest education level: Not on file  Occupational History   Not on file  Tobacco Use   Smoking status: Never   Smokeless tobacco: Never  Vaping Use   Vaping Use: Never used  Substance and Sexual Activity   Alcohol use: Not Currently   Drug use: Never   Sexual activity: Not on file  Other Topics Concern   Not on file  Social History Narrative   Not on file   Social Determinants of Health   Financial Resource Strain: Not on file  Food Insecurity: Not on file  Transportation Needs: Not on file  Physical Activity: Not on file  Stress: Not on file  Social Connections: Not on file  Intimate Partner Violence: Not on file    No  family history on file.  Current Outpatient Medications on File Prior to Visit  Medication Sig Dispense Refill   Ferrous Sulfate (IRON PO) Take 1 tablet by mouth daily.      Prenatal Vit-Fe Fumarate-FA (MULTIVITAMIN-PRENATAL) 27-0.8 MG TABS tablet Take 1 tablet by mouth daily.      No current facility-administered medications on file prior to visit.    Allergies as of 07/22/2021   (No Known Allergies)     ROS:   General:  No weight loss, Fever, chills  HEENT: No recent headaches, no nasal bleeding, no visual changes, no sore throat  Neurologic: No dizziness, blackouts, seizures. No recent symptoms of stroke or mini- stroke. No recent episodes of slurred speech, or temporary blindness.  Cardiac: No recent episodes of chest pain/pressure, no shortness of breath at rest.  No shortness of breath with exertion.  Denies history of atrial fibrillation or irregular heartbeat  Vascular: No history of rest pain in feet.  No history of claudication.  No history of non-healing ulcer, No history of DVT   Pulmonary: No home oxygen, no productive cough, no hemoptysis,  No asthma or wheezing  Musculoskeletal:  [ ]  Arthritis, [ ]  Low back pain,  [ ]  Joint pain  Hematologic:No history of hypercoagulable state.  No history of easy  bleeding.  No history of anemia  Gastrointestinal: No hematochezia or melena,  No gastroesophageal reflux, no trouble swallowing  Urinary: [ ]  chronic Kidney disease, [ ]  on HD - [ ]  MWF or [ ]  TTHS, [ ]  Burning with urination, [ ]  Frequent urination, [ ]  Difficulty urinating;   Skin: No rashes  Psychological: No history of anxiety,  No history of depression  Physical Examination  There were no vitals filed for this visit.  There is no height or weight on file to calculate BMI.  General:  Alert and oriented, no acute distress HEENT: Normal Neck: No bruit or JVD Pulmonary: Clear to auscultation bilaterally Cardiac: Regular Rate and Rhythm without  murmur Abdomen: Soft, non-tender, non-distended, no mass, no scars Skin: No rash         Extremity Pulses:  2+ radial, brachial, femoral, dorsalis pedis, posterior tibial pulses bilaterally Musculoskeletal: No deformity or edema  Neurologic: Upper and lower extremity motor 5/5 and symmetric  DATA:    +--------------+---------+------+-----------+------------+--------+  LEFT          Reflux NoRefluxReflux TimeDiameter cmsComments                          Yes                                   +--------------+---------+------+-----------+------------+--------+  CFV           no                                              +--------------+---------+------+-----------+------------+--------+  FV mid        no                                              +--------------+---------+------+-----------+------------+--------+  Popliteal     no                                              +--------------+---------+------+-----------+------------+--------+  GSV at SFJ              yes    >500 ms      0.40              +--------------+---------+------+-----------+------------+--------+  GSV prox thighno                            0.35              +--------------+---------+------+-----------+------------+--------+  GSV mid thigh           yes    >500 ms      0.35              +--------------+---------+------+-----------+------------+--------+  GSV dist thigh          yes    >500 ms      0.48              +--------------+---------+------+-----------+------------+--------+  GSV at knee  yes    >500 ms      0.38              +--------------+---------+------+-----------+------------+--------+  GSV prox calf           yes    >500 ms      0.33              +--------------+---------+------+-----------+------------+--------+  SSV Pop Fossa no                            0.18               +--------------+---------+------+-----------+------------+--------+  SSV prox calf no                            0.26              +--------------+---------+------+-----------+------------+--------+  SSV mid calf  no                            0.23              +--------------+---------+------+-----------+------------+--------+       Summary:  Left:  - No evidence of deep vein thrombosis seen in the left lower extremity,  from the common femoral through the popliteal veins.  - No evidence of superficial venous thrombosis in the left lower  extremity.     - Venous reflux is noted in the left sapheno-femoral junction.  - Venous reflux is noted in the left greater saphenous vein in the thigh.  - Venous reflux is noted in the left greater saphenous vein in the calf.     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  Plan:She will be measured and ordered thigh high compression for daily wear.  Elevation when at rest, exercise, water therapy if available.  F/U with vain MD in 3 months to consider interventional therapy treatments.  Mosetta Pigeon PA-C [facility] [place] Office: [phone]  MD in clinic [place]

## 2021-07-30 ENCOUNTER — Encounter (HOSPITAL_COMMUNITY): Payer: Medicaid Other

## 2021-07-30 IMAGING — US US OB < 14 WEEKS - US OB TV
1 series · 15 of 28 positions shown · non-contrast
Comparison: None.

CLINICAL DATA: Threatened abortion, LMP 12/27/2020

EXAM:
OBSTETRIC <14 WK US AND TRANSVAGINAL OB US
TECHNIQUE: Both transabdominal and transvaginal ultrasound examinations were
performed for complete evaluation of the gestation as well as the
maternal uterus, adnexal regions, and pelvic cul-de-sac.
Transvaginal technique was performed to assess early pregnancy.

[Series 1: us ob comp less 14 wks mc & wl · 86 acquisitions, 15 frames shown]
[im 1/86]
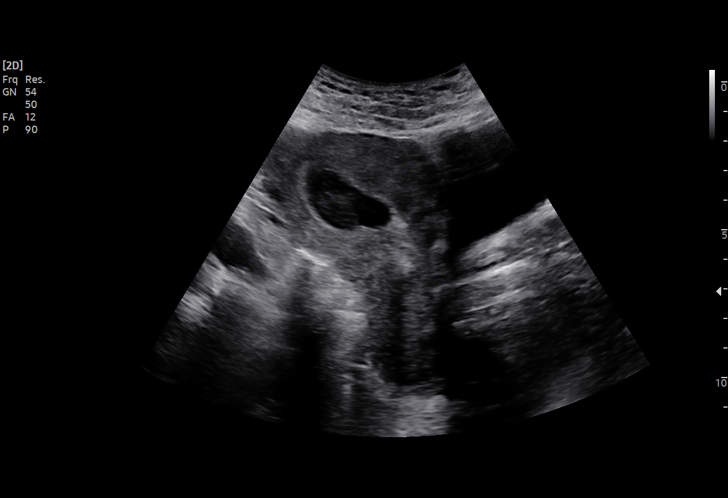
[im 7/86]
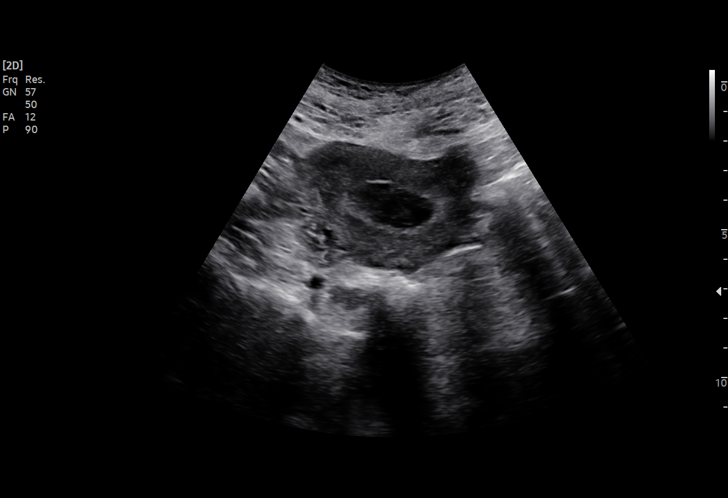
[im 13/86]
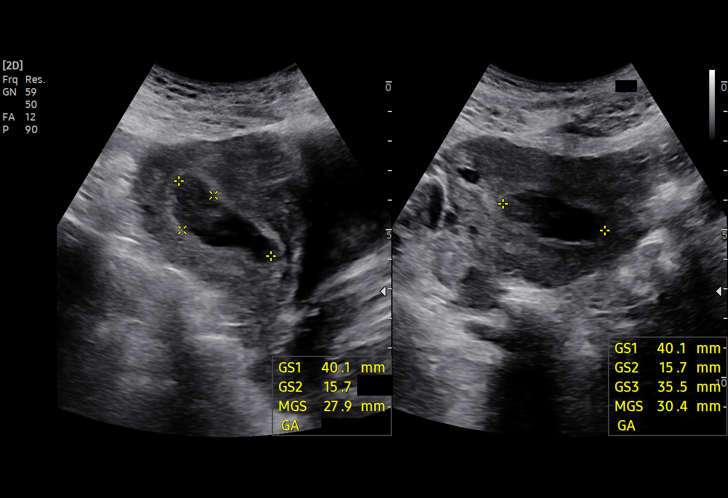
[im 19/86]
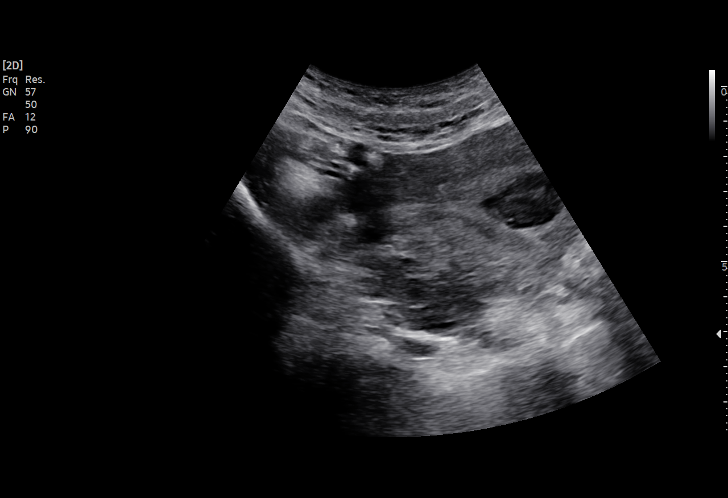
[im 26/86]
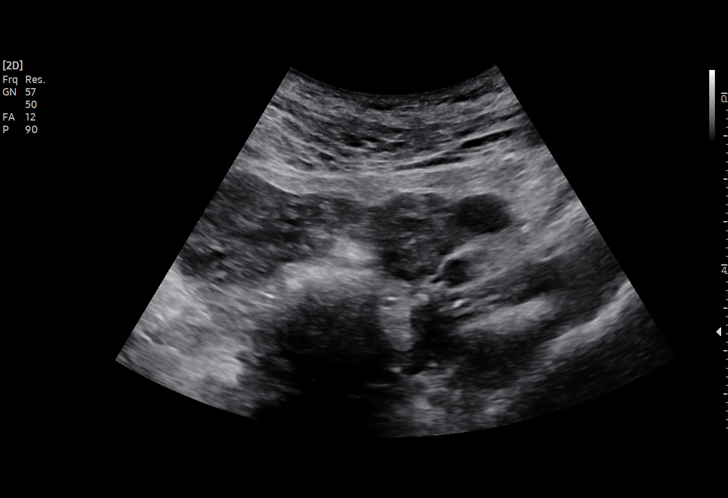
[im 32/86]
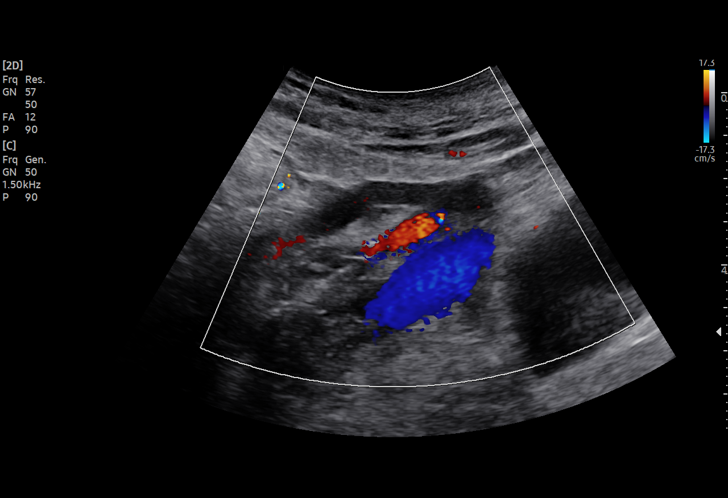
[im 38/86]
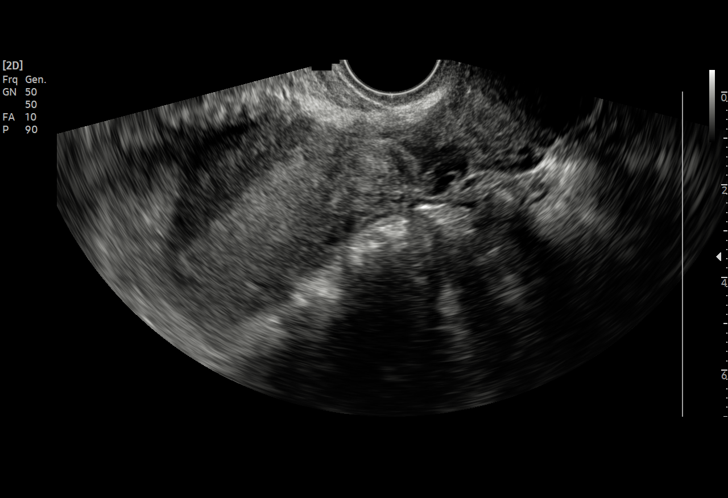
[im 45/86]
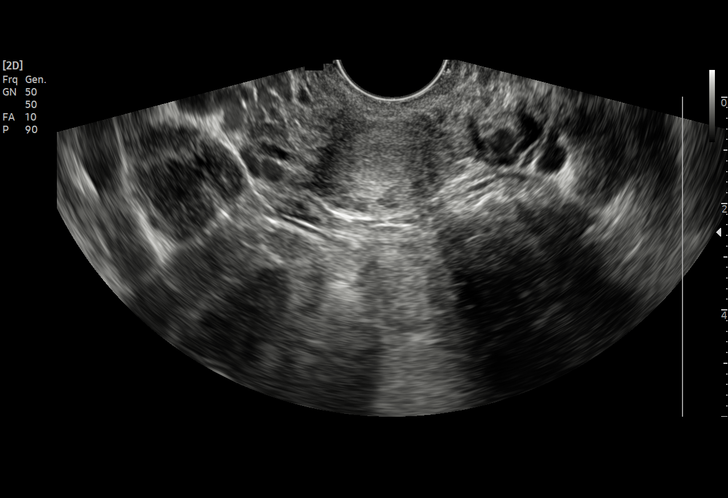
[im 48/86]
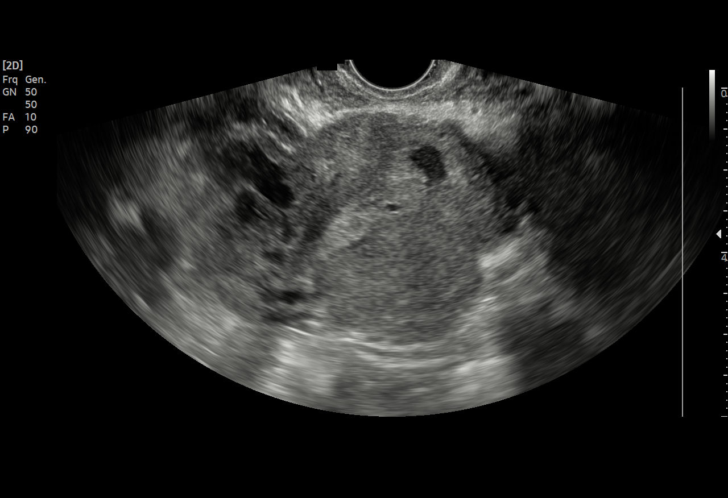
[im 54/86]
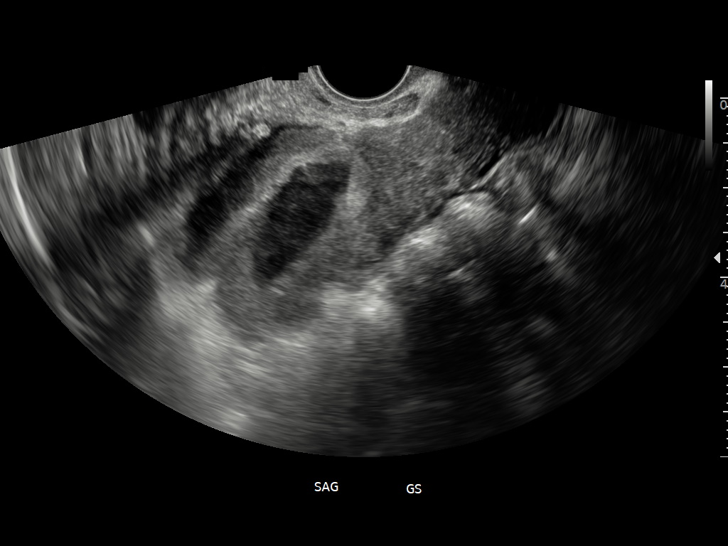
[im 60/86]
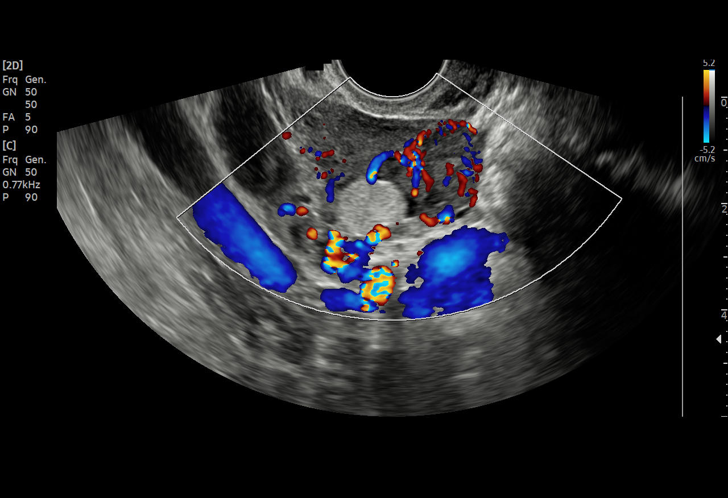
[im 67/86]
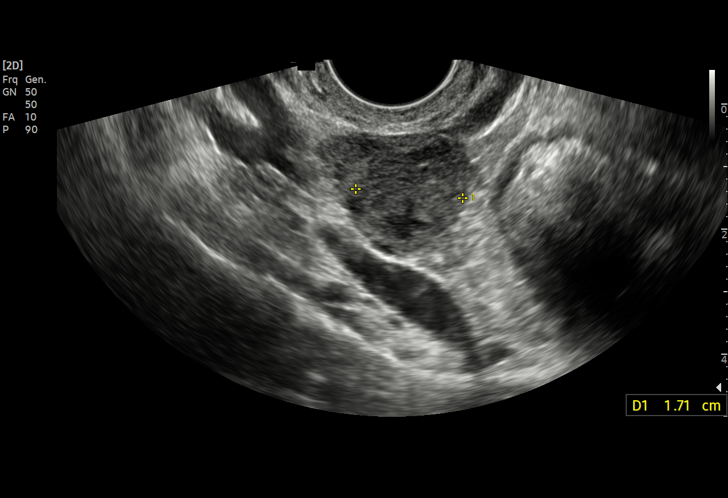
[im 73/86]
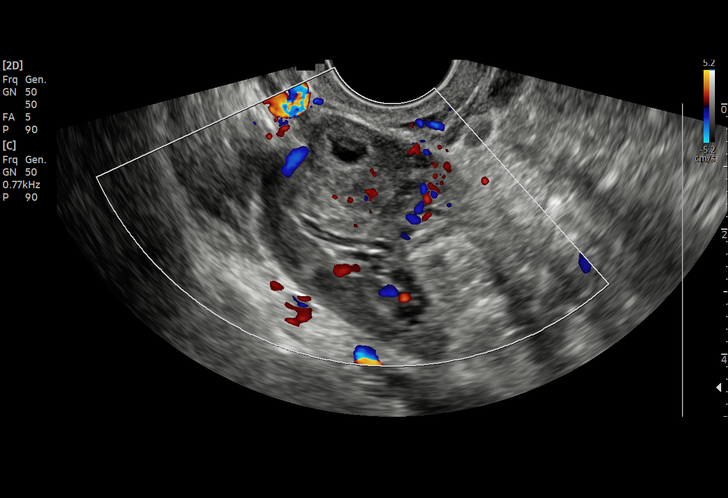
[im 79/86]
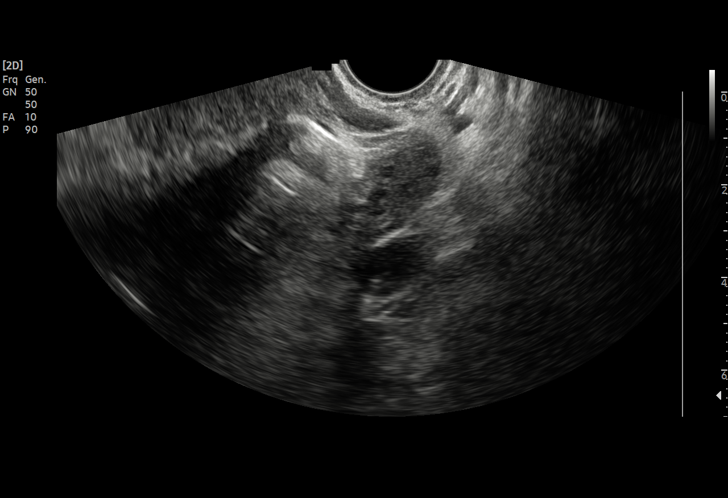
[im 86/86]
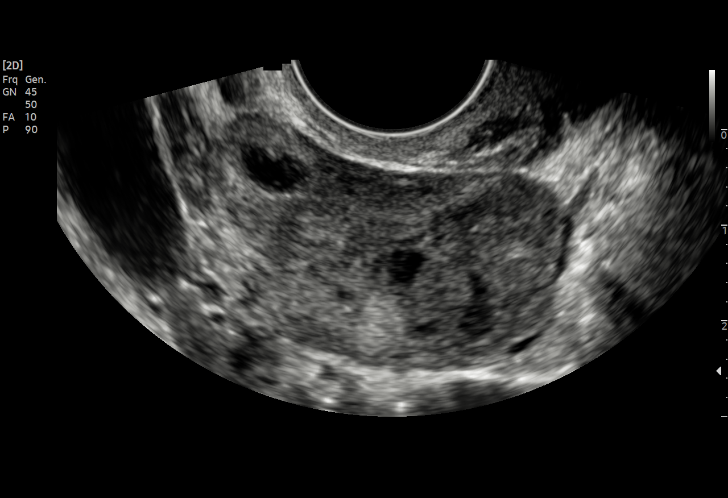

[15 of 28 positions shown; findings below may reference images not displayed]

FINDINGS: Intrauterine gestational sac: Present, single, containing extensive
relatively homogeneous echogenic debris likely representing blood
product

Yolk sac:  Not identified

Embryo:  Not identified

Cardiac Activity: Not applicable

MSD: 27 mm (transvaginal imaging) =  7 w   5 d

CRL: Not applicable

Subchorionic hemorrhage:  None visualized.

Maternal uterus/adnexae: The uterus is anteverted. No intrauterine
masses are seen. The cervix is closed and is unremarkable. No free
fluid is seen within the cul-de-sac. The maternal ovaries are normal
in size and echogenicity. Echogenic 1 cm structure within the right
ovary may represent a small dermoid cyst. Multiple follicles are
seen within the ovaries bilaterally.
IMPRESSION: Intrauterine gestational sac containing extensive echogenic debris
likely representing blood product. No visualized fetal pole,
abnormal given the gestational sac size. Together, the findings are
in keeping with a missed abortion.

## 2021-10-25 ENCOUNTER — Ambulatory Visit: Payer: Medicaid Other | Admitting: Surgery

## 2021-11-01 ENCOUNTER — Ambulatory Visit: Payer: Medicaid Other | Admitting: Surgery

## 2021-12-20 ENCOUNTER — Ambulatory Visit: Payer: Medicaid Other | Admitting: Surgery

## 2021-12-20 NOTE — Progress Notes (Incomplete)
? ?[facility] [place] ? ?Patient name: Angelica Farmer MRN: [id number] DOB: April 12, 1986 Sex: female ? ? ?REASON FOR VISIT:  ? ? ?Follow up ? ?HISOTRY OF PRESENT ILLNESS:  ? ? ?Angelica Farmer is a 36 y.o. female *** ? ? ?PAST MEDICAL HISTORY:  ? ?Past Medical History:  ?Diagnosis Date  ? Hx of abdominoplasty   ? PCOS (polycystic ovarian syndrome)   ? ? ? ?FAMILY HISTORY:  ? ?No family history on file. ? ?SOCIAL HISTORY:  ? ?Social History  ? ?Tobacco Use  ? Smoking status: Never  ? Smokeless tobacco: Never  ?Substance Use Topics  ? Alcohol use: Not Currently  ? ? ? ?ALLERGIES:  ? ?Not on File ? ? ?CURRENT MEDICATIONS:  ? ?Current Outpatient Medications  ?Medication Sig Dispense Refill  ? Ferrous Sulfate (IRON PO) Take 1 tablet by mouth daily.  (Patient not taking: Reported on 07/22/2021)    ? Prenatal Vit-Fe Fumarate-FA (MULTIVITAMIN-PRENATAL) 27-0.8 MG TABS tablet Take 1 tablet by mouth daily.  (Patient not taking: Reported on 07/22/2021)    ? ?No current facility-administered medications for this visit.  ? ? ?REVIEW OF SYSTEMS:  ? ?[X]  denotes positive finding, [ ]  denotes negative finding ?Cardiac  Comments:  ?Chest pain or chest pressure: ***   ?Shortness of breath upon exertion:    ?Short of breath when lying flat:    ?Irregular heart rhythm:    ?    ?Vascular    ?Pain in calf, thigh, or hip brought on by ambulation:    ?Pain in feet at night that wakes you up from your sleep:     ?Blood clot in your veins:    ?Leg swelling:     ?    ?Pulmonary    ?Oxygen at home:    ?Productive cough:     ?Wheezing:     ?    ?Neurologic    ?Sudden weakness in arms or legs:     ?Sudden numbness in arms or legs:     ?Sudden onset of difficulty speaking or slurred speech:    ?Temporary loss of vision in one eye:     ?Problems with dizziness:     ?    ?Gastrointestinal    ?Blood in stool:     ?Vomited blood:     ?    ?Genitourinary    ?Burning when urinating:     ?Blood in  urine:    ?    ?Psychiatric    ?Major depression:     ?    ?Hematologic    ?Bleeding problems:    ?Problems with blood clotting too easily:    ?    ?Skin    ?Rashes or ulcers:    ?    ?Constitutional    ?Fever or chills:    ? ? ?PHYSICAL EXAM:  ? ?There were no vitals filed for this visit. ? ?GENERAL: The patient is a well-nourished female, in no acute distress. The vital signs are documented above. ?CARDIAC: There is a regular rate and rhythm.  ?VASCULAR: *** ?PULMONARY: Non-labored respirations ?ABDOMEN: Soft and non-tender with normal pitched bowel sounds.  ?MUSCULOSKELETAL: There are no major deformities or cyanosis. ?NEUROLOGIC: No focal weakness or paresthesias are detected. ?SKIN: There are no ulcers or rashes noted. ?PSYCHIATRIC: The patient has a normal affect. ? ?STUDIES:  ? ?I have reviewed the following: ? ? ?+--------------+---------+------+-----------+------------+--------+  ?LEFT          Reflux NoRefluxReflux TimeDiameter cmsComments  ?  Yes                                   ?+--------------+---------+------+-----------+------------+--------+  ?CFV           no                                              ?+--------------+---------+------+-----------+------------+--------+  ?FV mid        no                                              ?+--------------+---------+------+-----------+------------+--------+  ?Popliteal     no                                              ?+--------------+---------+------+-----------+------------+--------+  ?GSV at [facility]              yes    >500 ms      0.40              ?+--------------+---------+------+-----------+------------+--------+  ?GSV prox thighno                            0.35              ?+--------------+---------+------+-----------+------------+--------+  ?GSV mid thigh           yes    >500 ms      0.35               ?+--------------+---------+------+-----------+------------+--------+  ?GSV dist thigh          yes    >500 ms      0.48              ?+--------------+---------+------+-----------+------------+--------+  ?GSV at knee             yes    >500 ms      0.38              ?+--------------+---------+------+-----------+------------+--------+  ?GSV prox calf           yes    >500 ms      0.33              ?+--------------+---------+------+-----------+------------+--------+  ?SSV Pop Fossa no                            0.18              ?+--------------+---------+------+-----------+------------+--------+  ?SSV prox calf no                            0.26              ?+--------------+---------+------+-----------+------------+--------+  ?SSV mid calf  no                            0.23              ?+--------------+---------+------+-----------+------------+--------+  ? ?  MEDICAL ISSUES:  ? ?*** ? ? ? ?Durene Cal, IV, MD, FACS ?[facility] [place] ?Tel [phone] ?Pager [phone]  ?

## 2021-12-24 ENCOUNTER — Ambulatory Visit: Payer: Medicaid Other | Admitting: Surgery

## 2021-12-27 ENCOUNTER — Encounter: Payer: Self-pay | Admitting: Surgery

## 2021-12-27 ENCOUNTER — Ambulatory Visit (INDEPENDENT_AMBULATORY_CARE_PROVIDER_SITE_OTHER): Payer: BC Managed Care – PPO | Admitting: Surgery

## 2021-12-27 ENCOUNTER — Other Ambulatory Visit: Payer: Self-pay

## 2021-12-27 VITALS — BP 109/68 | HR 84 | Temp 98.6°F | Resp 20 | Ht 64.0 in | Wt 165.0 lb

## 2021-12-27 DIAGNOSIS — I8393 Asymptomatic varicose veins of bilateral lower extremities: Secondary | ICD-10-CM

## 2021-12-27 NOTE — Progress Notes (Signed)
? ?[facility] [place] ? ?Patient name: Angelica Farmer MRN: [id number] DOB: 01-22-1986 Sex: female ? ? ?REASON FOR VISIT:  ? ? ?Follow up ? ?HISOTRY OF PRESENT ILLNESS:  ? ? ?Angelica Farmer is a 36 y.o. female who was evaluated in the office several months ago for leg swelling.  This is begun greater than 6 months prior to her presentation.  It was associated with prolonged sitting and standing.  She complains of pain as well as itching.  She did get some benefit from compression socks she denies any history of DVT.  There is a family history of venous insufficiency. ? ?She has had similar problems for years ago on the right leg which was treated with laser ablation.  On the left, she has clusters of painful varicosities. ? ? ?PAST MEDICAL HISTORY:  ? ?Past Medical History:  ?Diagnosis Date  ? Hx of abdominoplasty   ? PCOS (polycystic ovarian syndrome)   ? ? ? ?FAMILY HISTORY:  ? ?History reviewed. No pertinent family history. ? ?SOCIAL HISTORY:  ? ?Social History  ? ?Tobacco Use  ? Smoking status: Never  ? Smokeless tobacco: Never  ?Substance Use Topics  ? Alcohol use: Not Currently  ? ? ? ?ALLERGIES:  ? ?No Known Allergies ? ? ?CURRENT MEDICATIONS:  ? ?Current Outpatient Medications  ?Medication Sig Dispense Refill  ? Biotin 10 MG CAPS Take by mouth.    ? ?No current facility-administered medications for this visit.  ? ? ?REVIEW OF SYSTEMS:  ? ?[X]  denotes positive finding, [ ]  denotes negative finding ?Cardiac  Comments:  ?Chest pain or chest pressure:    ?Shortness of breath upon exertion:    ?Short of breath when lying flat:    ?Irregular heart rhythm:    ?    ?Vascular    ?Pain in calf, thigh, or hip brought on by ambulation:    ?Pain in feet at night that wakes you up from your sleep:     ?Blood clot in your veins:    ?Leg swelling:     ?    ?Pulmonary    ?Oxygen at home:    ?Productive cough:     ?Wheezing:     ?    ?Neurologic    ?Sudden weakness  in arms or legs:     ?Sudden numbness in arms or legs:     ?Sudden onset of difficulty speaking or slurred speech:    ?Temporary loss of vision in one eye:     ?Problems with dizziness:     ?    ?Gastrointestinal    ?Blood in stool:     ?Vomited blood:     ?    ?Genitourinary    ?Burning when urinating:     ?Blood in urine:    ?    ?Psychiatric    ?Major depression:     ?    ?Hematologic    ?Bleeding problems:    ?Problems with blood clotting too easily:    ?    ?Skin    ?Rashes or ulcers:    ?    ?Constitutional    ?Fever or chills:    ? ? ?PHYSICAL EXAM:  ? ?Vitals:  ? 12/27/21 1212  ?BP: 109/68  ?Pulse: 84  ?Resp: 20  ?Temp: 98.6 ?F (37 ?C)  ?SpO2: 98%  ?Weight: 165 lb (74.8 kg)  ?Height: 5\' 4"  (1.626 m)  ? ? ?GENERAL: The patient is a well-nourished female, in no acute distress.  The vital signs are documented above. ?CARDIAC: There is a regular rate and rhythm.  ?VASCULAR: Trace edema bilaterally.  Spider veins present ?PULMONARY: Non-labored respirations ?ABDOMEN: Soft and non-tender with normal pitched bowel sounds.  ?MUSCULOSKELETAL: There are no major deformities or cyanosis. ?NEUROLOGIC: No focal weakness or paresthesias are detected. ?SKIN: There are no ulcers or rashes noted. ?PSYCHIATRIC: The patient has a normal affect. ? ?STUDIES:  ? ?I have reviewed the following reflux study: ?--------------+---------+------+-----------+------------+--------+  ?LEFT          Reflux NoRefluxReflux TimeDiameter cmsComments  ?                        Yes                                   ?+--------------+---------+------+-----------+------------+--------+  ?CFV           no                                              ?+--------------+---------+------+-----------+------------+--------+  ?FV mid        no                                              ?+--------------+---------+------+-----------+------------+--------+  ?Popliteal     no                                               ?+--------------+---------+------+-----------+------------+--------+  ?[facility]              yes    >500 ms      0.40              ?+--------------+---------+------+-----------+------------+--------+  ?GSV prox thighno                            0.35              ?+--------------+---------+------+-----------+------------+--------+  ?GSV mid thigh           yes    >500 ms      0.35              ?+--------------+---------+------+-----------+------------+--------+  ?GSV dist thigh          yes    >500 ms      0.48              ?+--------------+---------+------+-----------+------------+--------+  ?GSV at knee             yes    >500 ms      0.38              ?+--------------+---------+------+-----------+------------+--------+  ?GSV prox calf           yes    >500 ms      0.33              ?+--------------+---------+------+-----------+------------+--------+  ?SSV Pop Fossa no  0.18              ?+--------------+---------+------+-----------+------------+--------+  ?SSV prox calf no                            0.26              ?+--------------+---------+------+-----------+------------+--------+  ?SSV mid calf  no                            0.23              ?+--------------+---------+------+-----------+------------+--------+  ? ?MEDICAL ISSUES:  ? ?Venous insufficiency: I reviewed the patient's duplex with her.  She does have saphenous reflux on the left, however I do not think that her diameters are large enough for laser ablation.  The majority of her problems are associated around clusters of spider veins.  I think that she would be a good candidate to have these treated with sclerotherapy.  I had need to come in and evaluate the patient.  She is going to get this scheduled ? ? ? ?Durene Cal, IV, MD, FACS ?[facility] [place] ?Tel [phone] ?Pager [phone]  ?

## 2022-01-04 ENCOUNTER — Other Ambulatory Visit: Payer: Self-pay

## 2022-01-04 ENCOUNTER — Encounter: Payer: Self-pay | Admitting: Obstetrics and Gynecology

## 2022-01-04 ENCOUNTER — Other Ambulatory Visit (HOSPITAL_COMMUNITY)
Admission: RE | Admit: 2022-01-04 | Discharge: 2022-01-04 | Disposition: A | Discharge status: Home / Self Care | Payer: BC Managed Care – PPO | Source: Ambulatory Visit | Attending: Obstetrics and Gynecology | Admitting: Obstetrics and Gynecology

## 2022-01-04 ENCOUNTER — Ambulatory Visit (INDEPENDENT_AMBULATORY_CARE_PROVIDER_SITE_OTHER): Payer: BC Managed Care – PPO | Admitting: Obstetrics and Gynecology

## 2022-01-04 VITALS — BP 126/77 | HR 89 | Ht 64.0 in | Wt 175.0 lb

## 2022-01-04 DIAGNOSIS — Z113 Encounter for screening for infections with a predominantly sexual mode of transmission: Secondary | ICD-10-CM | POA: Insufficient documentation

## 2022-01-04 DIAGNOSIS — Z01419 Encounter for gynecological examination (general) (routine) without abnormal findings: Secondary | ICD-10-CM

## 2022-01-04 NOTE — Progress Notes (Signed)
? ? ?GYNECOLOGY ANNUAL PREVENTATIVE CARE ENCOUNTER NOTE ? ?History:    ? Angelica Farmer is a 36 y.o. [phone] female here for a routine annual gynecologic exam.  Current complaints: interested in nonhormonal birth control.   Denies abnormal vaginal bleeding, discharge, pelvic pain, problems with intercourse or other gynecologic concerns.  ?  ?Gynecologic History ?Patient's last menstrual period was 12/10/2021. ?Contraception: none ?Last Pap: 14-15 months ago. Results were: normal with negative HPV ? ? ?Obstetric History ?OB History  ?Gravida Para Term Preterm AB Living  ?5 4 2 2   3   ?SAB IAB Ectopic Multiple Live Births  ?        3  ?  ?# Outcome Date GA Lbr Len/2nd Weight Sex Delivery Anes PTL Lv  ?5 Gravida           ?4 Preterm  [redacted]w[redacted]d       FD  ?3 Preterm      CS-Unspec   LIV  ?2 Term      CS-Unspec   LIV  ?1 Term      CS-Unspec   LIV  ? ? ?Past Medical History:  ?Diagnosis Date  ? Hx of abdominoplasty   ? PCOS (polycystic ovarian syndrome)   ? ? ?Past Surgical History:  ?Procedure Laterality Date  ? BREAST SURGERY    ? CESAREAN SECTION    ? ? ?Current Outpatient Medications on File Prior to Visit  ?Medication Sig Dispense Refill  ? Biotin 10 MG CAPS Take by mouth.    ? ?No current facility-administered medications on file prior to visit.  ? ? ?No Known Allergies ? ?Social History:  reports that she has never smoked. She has never used smokeless tobacco. She reports current alcohol use. She reports that she does not use drugs. ? ?Family History  ?Problem Relation Age of Onset  ? Cancer Maternal Grandmother   ? Cancer Maternal Grandfather   ? ? ?The following portions of the patient's history were reviewed and updated as appropriate: allergies, current medications, past family history, past medical history, past social history, past surgical history and problem list. ? ?Review of Systems ?Pertinent items noted in HPI and remainder of comprehensive ROS otherwise negative. ? ?Physical Exam:  ?BP 126/77   Pulse  89   Ht 5\' 4"  (1.626 m)   Wt 175 lb (79.4 kg)   LMP 12/10/2021   BMI 30.04 kg/m?  ?CONSTITUTIONAL: Well-developed, well-nourished female in no acute distress.  ?HENT:  Normocephalic, atraumatic, External right and left ear normal. Oropharynx is clear and moist ?EYES: Conjunctivae and EOM are normal.   ?NECK: Normal range of motion, supple, no masses.  Normal thyroid.  ?SKIN: Skin is warm and dry. No rash noted. Not diaphoretic. No erythema. No pallor. ?MUSCULOSKELETAL: Normal range of motion. No tenderness.  No cyanosis, clubbing, or edema.  2+ distal pulses. ?NEUROLOGIC: Alert and oriented to person, place, and time. Normal reflexes, muscle tone coordination.  ?PSYCHIATRIC: Normal mood and affect. Normal behavior. Normal judgment and thought content. ?CARDIOVASCULAR: Normal heart rate noted, regular rhythm ?RESPIRATORY: Clear to auscultation bilaterally. Effort and breath sounds normal, no problems with respiration noted. ?BREASTS: Symmetric in size. No masses, tenderness, skin changes, nipple drainage, or lymphadenopathy bilaterally. Breast augmentation noted. Performed in the presence of a chaperone. ?ABDOMEN: Soft, no distention noted.  No tenderness, rebound or guarding. Abdominoplasty scar noted. ?PELVIC: Normal appearing external genitalia and urethral meatus; normal appearing vaginal mucosa.  Cervix can be palpated, but is not easily visualized in the  vagina.  No abnormal discharge noted.   Normal uterine size, no other palpable masses, no uterine or adnexal tenderness.  Uterus may be tethered to previous abdominal scar.  Performed in the presence of a chaperone. ?  ?Assessment and Plan:  ?  1. Women's annual routine gynecological examination ?Overall normal annual exam other than poor visualization of cervix and potential tethering of uterus to the scar 2/2 4 previous c/s x 4. ?Discussed birth control options.  IUD placement would be very difficult.   Nexplanon may be an option, pamphlet given/  Also  discussed combined OCPs.  Surgical intervention is also an option. ? ?2. Routine screening for STI (sexually transmitted infection) ?Per pt request ?- Cervicovaginal ancillary only( [facility]) ?- HIV Antibody (routine testing w rflx) ?- Hepatitis B surface antigen ?- RPR ?- Hepatitis C antibody ? ?Will follow up STD labs. ? ?Routine preventative health maintenance measures emphasized. ?Please refer to After Visit Summary for other counseling recommendations.  ?   ? ?Mariel Aloe, MD, FACOG ?Obstetrician Heritage manager, Faculty Practice ?Center for [employer], [facility]  ?

## 2022-01-04 NOTE — Progress Notes (Signed)
Pt is new to our office, here today for routine GYN. ?Pt states last gyn exam about 14-15 months ago.  ?Pt states last pap was normal. ? ?Pt would like to discuss BC options, ? Non hormonal method.  ? ?PHQ and GAD score negative today.  ? ? ? ?

## 2022-01-05 LAB — CERVICOVAGINAL ANCILLARY ONLY
Bacterial Vaginitis (gardnerella): NEGATIVE
Candida Glabrata: NEGATIVE
Candida Vaginitis: NEGATIVE
Chlamydia: NEGATIVE
Comment: NEGATIVE
Comment: NEGATIVE
Comment: NEGATIVE
Comment: NEGATIVE
Comment: NEGATIVE
Comment: NORMAL
Neisseria Gonorrhea: NEGATIVE
Trichomonas: NEGATIVE

## 2022-01-05 LAB — HEPATITIS B SURFACE ANTIGEN: Hepatitis B Surface Ag: NEGATIVE

## 2022-01-05 LAB — HEPATITIS C ANTIBODY: Hep C Virus Ab: NONREACTIVE

## 2022-01-05 LAB — HIV ANTIBODY (ROUTINE TESTING W REFLEX): HIV Screen 4th Generation wRfx: NONREACTIVE

## 2022-01-05 LAB — RPR: RPR Ser Ql: NONREACTIVE

## 2022-01-18 ENCOUNTER — Encounter: Payer: Self-pay | Admitting: Obstetrics

## 2022-01-18 ENCOUNTER — Ambulatory Visit (INDEPENDENT_AMBULATORY_CARE_PROVIDER_SITE_OTHER): Payer: BC Managed Care – PPO | Admitting: Obstetrics

## 2022-01-18 DIAGNOSIS — Z3201 Encounter for pregnancy test, result positive: Secondary | ICD-10-CM

## 2022-01-18 DIAGNOSIS — Z30011 Encounter for initial prescription of contraceptive pills: Secondary | ICD-10-CM

## 2022-01-18 DIAGNOSIS — Z3009 Encounter for other general counseling and advice on contraception: Secondary | ICD-10-CM

## 2022-01-18 LAB — POCT URINE PREGNANCY: Preg Test, Ur: POSITIVE — AB

## 2022-01-18 MED ORDER — LO LOESTRIN FE 1 MG-10 MCG / 10 MCG PO TABS: 1.0000 | ORAL_TABLET | Freq: Every day | ORAL | 4 refills | Status: DC

## 2022-01-18 NOTE — Progress Notes (Signed)
Pt is in the office to discuss [facility] options ?LMP 01/15/22 ?Wants to discuss pill options ? ?

## 2022-01-18 NOTE — Progress Notes (Signed)
Subjective:  ? ? Angelica Farmer is a 36 y.o. female who presents for contraception counseling. The patient has no complaints today. The patient is sexually active. Pertinent past medical history: PCOS. ? ?The information documented in the HPI was reviewed and verified. ? ?Menstrual History: ?OB History   ? ? Gravida  ?5  ? Para  ?4  ? Term  ?2  ? Preterm  ?2  ? AB  ?   ? Living  ?4  ?  ? ? SAB  ?   ? IAB  ?   ? Ectopic  ?   ? Multiple  ?   ? Live Births  ?4  ?   ?  ?  ?  ? ?Patient's last menstrual period was 01/15/2022. ?  ?Patient Active Problem List  ? Diagnosis Date Noted  ? Abnormal chromosomal and genetic finding on antenatal screening mother 05/29/2020  ? Antepartum anemia complicating pregnancy [id number]  ? History of abdominoplasty 05/29/2020  ? History of placenta previa 05/29/2020  ? Hx of cesarean section 05/29/2020  ? Supervision of other high-risk pregnancy 05/29/2020  ? ?Past Medical History:  ?Diagnosis Date  ? Hx of abdominoplasty   ? PCOS (polycystic ovarian syndrome)   ?  ?Past Surgical History:  ?Procedure Laterality Date  ? BREAST SURGERY    ? CESAREAN SECTION    ?  ? ?Current Outpatient Medications:  ?  Biotin 10 MG CAPS, Take by mouth., Disp: , Rfl:  ?  LO LOESTRIN FE 1 MG-10 MCG / 10 MCG tablet, Take 1 tablet by mouth daily., Disp: 84 tablet, Rfl: 4 ?No Known Allergies  ?Social History  ? ?Tobacco Use  ? Smoking status: Never  ? Smokeless tobacco: Never  ?Substance Use Topics  ? Alcohol use: Yes  ?  Comment: occ  ?  ?Family History  ?Problem Relation Age of Onset  ? Cancer Maternal Grandmother   ? Cancer Maternal Grandfather   ?  ? ? ? ?Review of Systems ?Constitutional: negative for weight loss ?Genitourinary:negative for abnormal menstrual periods and vaginal discharge ? ? ?Objective:  ? ?BP 124/79   Pulse 76   Ht 5\' 4"  (1.626 m)   Wt 172 lb 9.6 oz (78.3 kg)   LMP 01/15/2022   BMI 29.63 kg/m?  ?  ?General:   Alert and no distress  ?Skin:   no rash or abnormalities  ?Lungs:   clear  to auscultation bilaterally  ?Heart:   regular rate and rhythm, S1, S2 normal, no murmur, click, rub or gallop  ?The remainder of the physical exam deferred due to the  type of encounter. ? ?Lab Review ?Urine pregnancy test:  POSITIVE ?Labs reviewed yes ?Radiologic studies reviewed no ? ?I have spent a total of 20 minutes of face-to-face time, excluding clinical staff time, reviewing notes and preparing to see patient, ordering tests and/or medications, and counseling the patient.  ? ?Assessment:  ? ? 36 y.o., starting OCP (estrogen/progesterone), no contraindications.  ? ?Plan:  ? ?1. Postpartum care following cesarean delivery ? ?2. Encounter for counseling regarding contraception ?- wants a low-dose OCP ? ?3. Encounter for initial prescription of contraceptive pills ?Rx: ?- POCT urine pregnancy:  POSITIVE ?- LO LOESTRIN FE 1 MG-10 MCG / 10 MCG tablet; Take 1 tablet by mouth daily.  Dispense: 84 tablet; Refill: 4 ? ?4. Positive pregnancy test ?- WILL CALL PATIENT AND DISCONTINUE OCP'S ? ?  ? All questions answered. ?Contraception: OCP (estrogen/progesterone). ?Discussed healthy lifestyle modifications. ?Neurosurgeon distributed. ?  Follow up in 1 year. ?Follow up as needed. ? ?Shelly Bombard, MD ?01/18/2022 4:44 PM  ?

## 2022-01-21 ENCOUNTER — Ambulatory Visit: Payer: BC Managed Care – PPO

## 2022-02-03 ENCOUNTER — Encounter: Payer: Self-pay | Admitting: Obstetrics

## 2022-08-31 ENCOUNTER — Encounter: Payer: Self-pay | Admitting: Family

## 2022-08-31 ENCOUNTER — Ambulatory Visit (INDEPENDENT_AMBULATORY_CARE_PROVIDER_SITE_OTHER): Payer: Medicaid Other | Admitting: Family

## 2022-08-31 VITALS — BP 118/78 | HR 73 | Temp 97.8°F | Resp 20 | Ht 64.0 in | Wt 180.0 lb

## 2022-08-31 DIAGNOSIS — Z01818 Encounter for other preprocedural examination: Secondary | ICD-10-CM

## 2022-08-31 DIAGNOSIS — Z7689 Persons encountering health services in other specified circumstances: Secondary | ICD-10-CM

## 2022-08-31 NOTE — Patient Instructions (Signed)
Please obtain medical records for Pap smear and Tetanus vaccine

## 2022-08-31 NOTE — Progress Notes (Signed)
Provider: Marlowe Sax FNP-C   Sueanne Maniaci, Nelda Bucks, NP  Patient Care Team: Kema Santaella, Nelda Bucks, NP as PCP - General (Family Medicine)  Extended Emergency Contact Information Primary Emergency Contact: [place] [facility] Phone: [phone] Relation: Mother Secondary Emergency Contact: [facility] Phone: [phone] Relation: Spouse Preferred language: English Interpreter needed? No  Code Status:  Full Code  Goals of care: Advanced Directive information    08/31/2022    2:31 PM  Advanced Directives  Does Patient Have a Medical Advance Directive? No  Would patient like information on creating a medical advance directive? No - Patient declined     Chief Complaint  Patient presents with   Acute Visit   Establish Care    Patient here to establish care has medication bottles at initial appointment    HPI:  Pt is a 36 y.o. female seen today establish care here at [facility].States has no chronic diseases.Has not been seen by PCP for several year.  She does not take any regular medication except Lo Loestrin 1 mg.LMP 08/21/2022  States here also for pre-op lab work and EKG for breast Surgery.Request CBC/diff,CMP,Lipid panel ,HCG and HIV to be drawn.PT/PTT,INR also requested on her Mychart but not current on any anticoagulant. Drinks 2 glasses per week. Has had 7 lbs weight gain but patient states was weighed with her work gun and other equipment on her waist belt.she works as an Materials engineer.   Past Medical History:  Diagnosis Date   Hx of abdominoplasty    PCOS (polycystic ovarian syndrome)    Past Surgical History:  Procedure Laterality Date   BREAST SURGERY     CESAREAN SECTION      No Known Allergies  Allergies as of 08/31/2022   No Known Allergies      Medication List        Accurate as of August 31, 2022  2:58 PM. If you have any questions, ask your nurse or doctor.          Biotin 10 MG Caps Take by mouth.   Lo  Loestrin Fe 1 MG-10 MCG / 10 MCG tablet Generic drug: Norethindrone-Ethinyl Estradiol-Fe Biphas Take 1 tablet by mouth daily.        Review of Systems  Constitutional:  Negative for appetite change, chills, fatigue, fever and unexpected weight change.  HENT:  Negative for congestion, dental problem, ear discharge, ear pain, facial swelling, hearing loss, nosebleeds, postnasal drip, rhinorrhea, sinus pressure, sinus pain, sneezing, sore throat, tinnitus and trouble swallowing.   Eyes:  Negative for pain, discharge, redness, itching and visual disturbance.  Respiratory:  Negative for cough, chest tightness, shortness of breath and wheezing.   Cardiovascular:  Negative for chest pain, palpitations and leg swelling.  Gastrointestinal:  Negative for abdominal distention, abdominal pain, blood in stool, constipation, diarrhea, nausea and vomiting.  Endocrine: Negative for cold intolerance, heat intolerance, polydipsia, polyphagia and polyuria.  Genitourinary:  Negative for difficulty urinating, dysuria, flank pain, frequency and urgency.  Musculoskeletal:  Negative for arthralgias, back pain, gait problem, joint swelling, myalgias, neck pain and neck stiffness.  Skin:  Negative for color change, pallor, rash and wound.  Neurological:  Negative for dizziness, syncope, speech difficulty, weakness, light-headedness, numbness and headaches.  Hematological:  Does not bruise/bleed easily.  Psychiatric/Behavioral:  Negative for agitation, behavioral problems, confusion, hallucinations, self-injury, sleep disturbance and suicidal ideas. The patient is not nervous/anxious.      There is no immunization history on file for this patient. Pertinent  Health Maintenance Due  Topic Date Due   PAP SMEAR-Modifier  Never done   INFLUENZA VACCINE  Never done      06/04/2019    4:59 PM 11/11/2019    8:17 PM 04/29/2020   10:56 AM 03/03/2021   10:46 PM 08/31/2022    2:31 PM  Fall Risk  Falls in the past year?      0  Was there an injury with Fall?     0  Fall Risk Category Calculator     0  Fall Risk Category     Low  Patient Fall Risk Level _0   Patient at Risk for Falls Due to     No Fall Risks  Fall risk Follow up     Falls evaluation completed   Functional Status Survey:    Vitals:   08/31/22 1432  BP: 118/78  Pulse: 73  Resp: 20  Temp: 97.8 F (36.6 C)  SpO2: 95%  Weight: 180 lb (81.6 kg)  Height: _1  (1.626 m)   Body mass index is 30.9 kg/m. Physical Exam Vitals reviewed.  Constitutional:      General: She is not in acute distress.    Appearance: Normal appearance. She is normal weight. She is not ill-appearing or diaphoretic.  HENT:     Head: Normocephalic.     Right Ear: Tympanic membrane, ear canal and external ear normal. There is no impacted cerumen.     Left Ear: Tympanic membrane, ear canal and external ear normal. There is no impacted cerumen.     Nose: Nose normal. No congestion or rhinorrhea.     Mouth/Throat:     Mouth: Mucous membranes are moist.     Pharynx: Oropharynx is clear. No oropharyngeal exudate or posterior oropharyngeal erythema.  Eyes:     General: No scleral icterus.       Right eye: No discharge.        Left eye: No discharge.     Extraocular Movements: Extraocular movements intact.     Conjunctiva/sclera: Conjunctivae normal.     Pupils: Pupils are equal, round, and reactive to light.  Neck:     Vascular: No carotid bruit.  Cardiovascular:     Rate and Rhythm: Normal rate and regular rhythm.     Pulses: Normal pulses.     Heart sounds: Normal heart sounds. No murmur heard.    No friction rub. No gallop.  Pulmonary:     Effort: Pulmonary effort is normal. No respiratory distress.     Breath sounds: Normal breath sounds. No wheezing, rhonchi or rales.  Chest:     Chest wall: No tenderness.  Abdominal:     General: Bowel sounds are normal. There is no distension.      Palpations: Abdomen is soft. There is no mass.     Tenderness: There is no abdominal tenderness. There is no right CVA tenderness, left CVA tenderness, guarding or rebound.  Musculoskeletal:        General: No swelling or tenderness. Normal range of motion.     Cervical back: Normal range of motion. No rigidity or tenderness.     Right lower leg: No edema.     Left lower leg: No edema.  Lymphadenopathy:     Cervical: No cervical adenopathy.  Skin:    General: Skin is warm and dry.     Coloration: Skin is not pale.  Findings: No bruising, erythema, lesion or rash.  Neurological:     Mental Status: She is alert and oriented to person, place, and time.     Cranial Nerves: No cranial nerve deficit.     Sensory: No sensory deficit.     Motor: No weakness.     Coordination: Coordination normal.     Gait: Gait normal.  Psychiatric:        Mood and Affect: Mood normal.        Speech: Speech normal.        Behavior: Behavior normal.        Thought Content: Thought content normal.        Judgment: Judgment normal.    Labs reviewed: No results for input(s): "NA", "K", "CL", "CO2", "GLUCOSE", "BUN", "CREATININE", "CALCIUM", "MG", "PHOS" in the last 8760 hours. No results for input(s): "AST", "ALT", "ALKPHOS", "BILITOT", "PROT", "ALBUMIN" in the last 8760 hours. No results for input(s): "WBC", "NEUTROABS", "HGB", "HCT", "MCV", "PLT" in the last 8760 hours. No results found for: "TSH" No results found for: "HGBA1C" No results found for: "CHOL", "HDL", "LDLCALC", "LDLDIRECT", "TRIG", "CHOLHDL"  Significant Diagnostic Results in last 30 days:  No results found.  Assessment/Plan 1. Encounter to establish care No previous medical records for review.Recommend to sign release of medical records then will update Tdap and Pap smear results which she states still up to date.   2. Pre-op exam Scheduled for breast Augmentation 09/22/2022 lab work and EKG requested.Notified by Eleanora Neighbor  that EKG leads tags has out of stock.patient to be rescheduled for EKG. Patient notified by CMA  - CBC with Differential/Platelet - CMP with eGFR([facility]) - HIV Antibody (routine testing w rflx) - HCG, Qualitative  Family/ staff Communication: Reviewed plan of care with patient verbalized understanding  Labs/tests ordered:  - CBC with Differential/Platelet - CMP with eGFR([facility]) - Lipid panel - HIV Antibody (routine testing w rflx)  Next Appointment : Return for annual Physical examination.   Sandrea Hughs, NP

## 2022-09-01 LAB — COMPLETE METABOLIC PANEL WITH GFR
AG Ratio: 1.5 (calc) (ref 1.0–2.5)
ALT: 9 U/L (ref 6–29)
AST: 17 U/L (ref 10–30)
Albumin: 4.6 g/dL (ref 3.6–5.1)
Alkaline phosphatase (APISO): 80 U/L (ref 31–125)
BUN/Creatinine Ratio: 11 (calc) (ref 6–22)
BUN: 11 mg/dL (ref 7–25)
CO2: 30 mmol/L (ref 20–32)
Calcium: 9.6 mg/dL (ref 8.6–10.2)
Chloride: 101 mmol/L (ref 98–110)
Creat: 1 mg/dL — ABNORMAL HIGH (ref 0.50–0.97)
Globulin: 3.1 g/dL (calc) (ref 1.9–3.7)
Glucose, Bld: 87 mg/dL (ref 65–139)
Potassium: 4 mmol/L (ref 3.5–5.3)
Sodium: 139 mmol/L (ref 135–146)
Total Bilirubin: 0.4 mg/dL (ref 0.2–1.2)
Total Protein: 7.7 g/dL (ref 6.1–8.1)
eGFR: 75 mL/min/{1.73_m2} (ref >=60)

## 2022-09-01 LAB — CBC WITH DIFFERENTIAL/PLATELET
Absolute Monocytes: 536 cells/uL (ref 200–950)
Basophils Absolute: 40 cells/uL (ref 0–200)
Basophils Relative: 0.7 %
Eosinophils Absolute: 160 cells/uL (ref 15–500)
Eosinophils Relative: 2.8 %
HCT: 39.3 % (ref 35.0–45.0)
Hemoglobin: 13.6 g/dL (ref 11.7–15.5)
Lymphs Abs: 1818 cells/uL (ref 850–3900)
MCH: 29.6 pg (ref 27.0–33.0)
MCHC: 34.6 g/dL (ref 32.0–36.0)
MCV: 85.6 fL (ref 80.0–100.0)
MPV: 10.7 fL (ref 7.5–12.5)
Monocytes Relative: 9.4 %
Neutro Abs: 3146 cells/uL (ref 1500–7800)
Neutrophils Relative %: 55.2 %
Platelets: 377 10*3/uL (ref 140–400)
RBC: 4.59 10*6/uL (ref 3.80–5.10)
RDW: 12.5 % (ref 11.0–15.0)
Total Lymphocyte: 31.9 %
WBC: 5.7 10*3/uL (ref 3.8–10.8)

## 2022-09-01 LAB — HCG, SERUM, QUALITATIVE: Preg, Serum: NEGATIVE

## 2022-09-01 LAB — HIV ANTIBODY (ROUTINE TESTING W REFLEX): HIV 1&2 Ab, 4th Generation: NONREACTIVE

## 2022-09-05 ENCOUNTER — Ambulatory Visit (INDEPENDENT_AMBULATORY_CARE_PROVIDER_SITE_OTHER): Payer: Medicaid Other | Admitting: Family

## 2022-09-05 ENCOUNTER — Encounter: Payer: Self-pay | Admitting: Family

## 2022-09-05 VITALS — BP 118/86 | HR 79 | Temp 98.4°F | Resp 16 | Ht 64.0 in | Wt 174.0 lb

## 2022-09-05 DIAGNOSIS — Z01818 Encounter for other preprocedural examination: Secondary | ICD-10-CM

## 2022-09-05 NOTE — Progress Notes (Unsigned)
Provider: Richarda Blade FNP-C  Yeison Sippel, [name], NP  Patient Care Team: Stefannie Defeo, [name], NP as PCP - General (Family Medicine)  Extended Emergency Contact Information Primary Emergency Contact: Persinger,Lisa Mobile Phone: [phone] Relation: Mother Secondary Emergency Contact: [facility] Phone: [phone] Relation: Spouse Preferred language: English Interpreter needed? No  Code Status: Full Code  Goals of care: Advanced Directive information    09/05/2022    2:43 PM  Advanced Directives  Does Patient Have a Medical Advance Directive? No  Would patient like information on creating a medical advance directive? No - Patient declined     Chief Complaint  Patient presents with   Follow-up    EKG-Pre-OP    HPI:  Pt is a 36 y.o. female seen today for an acute visit for EKG for pre-op.States was also told need PT/PTT/INR done and bilateral breast ultrasound for upcoming breast surgery on 09/22/2022.states has had previous breast reduction but after having her children requires another reduction.   She denies any breast pain or tenderness.    Past Medical History:  Diagnosis Date   Hx of abdominoplasty    PCOS (polycystic ovarian syndrome)    Past Surgical History:  Procedure Laterality Date   BREAST SURGERY     CESAREAN SECTION      Allergies  Allergen Reactions   Oxycodone Itching    Outpatient Encounter Medications as of 09/05/2022  Medication Sig   [DISCONTINUED] Biotin 10 MG CAPS Take by mouth.   [DISCONTINUED] LO LOESTRIN FE 1 MG-10 MCG / 10 MCG tablet Take 1 tablet by mouth daily.   No facility-administered encounter medications on file as of 09/05/2022.    Review of Systems  Constitutional:  Negative for appetite change, chills, fatigue, fever and unexpected weight change.  HENT:  Negative for congestion, dental problem, ear discharge, ear pain, facial swelling, hearing loss, nosebleeds, postnasal drip, rhinorrhea, sinus pressure,  sinus pain, sneezing, sore throat and tinnitus.   Eyes:  Negative for pain, discharge, redness, itching and visual disturbance.  Respiratory:  Negative for cough, chest tightness, shortness of breath and wheezing.   Cardiovascular:  Negative for chest pain, palpitations and leg swelling.  Gastrointestinal:  Negative for abdominal distention, abdominal pain, constipation, diarrhea, nausea and vomiting.  Endocrine: Negative for cold intolerance, heat intolerance, polydipsia, polyphagia and polyuria.  Genitourinary:  Negative for difficulty urinating, dysuria, flank pain, frequency and urgency.  Musculoskeletal:  Negative for arthralgias, back pain, gait problem, joint swelling, myalgias, neck pain and neck stiffness.  Skin:  Negative for color change, pallor, rash and wound.  Neurological:  Negative for dizziness, syncope, speech difficulty, weakness, light-headedness, numbness and headaches.  Hematological:  Does not bruise/bleed easily.  Psychiatric/Behavioral:  Negative for agitation, behavioral problems, confusion, hallucinations and sleep disturbance. The patient is not nervous/anxious.     There is no immunization history on file for this patient. Pertinent  Health Maintenance Due  Topic Date Due   PAP SMEAR-Modifier  Never done   INFLUENZA VACCINE  Discontinued      11/11/2019    8:17 PM 04/29/2020   10:56 AM 03/03/2021   10:46 PM 08/31/2022    2:31 PM 09/05/2022    2:42 PM  Fall Risk  Falls in the past year?    0 0  Was there an injury with Fall?    0 0  Fall Risk Category Calculator    0 0  Fall Risk Category    Low Low  Patient Fall Risk Level Low fall risk  Low fall risk Low fall risk Low fall risk Low fall risk  Patient at Risk for Falls Due to    No Fall Risks No Fall Risks  Fall risk Follow up    Falls evaluation completed Falls evaluation completed   Functional Status Survey:    Vitals:   09/05/22 1436  BP: 118/86  Pulse: 79  Resp: 16  Temp: 98.4 F (36.9 C)   SpO2: 93%  Weight: 174 lb (78.9 kg)  Height: 5\' 4"  (1.626 m)   Body mass index is 29.87 kg/m. Physical Exam Vitals reviewed.  Constitutional:      General: She is not in acute distress.    Appearance: Normal appearance. She is normal weight. She is not ill-appearing or diaphoretic.  HENT:     Head: Normocephalic.     Nose: Nose normal. No congestion or rhinorrhea.     Mouth/Throat:     Mouth: Mucous membranes are moist.     Pharynx: Oropharynx is clear. No oropharyngeal exudate or posterior oropharyngeal erythema.  Eyes:     General: No scleral icterus.       Right eye: No discharge.        Left eye: No discharge.     Extraocular Movements: Extraocular movements intact.     Conjunctiva/sclera: Conjunctivae normal.     Pupils: Pupils are equal, round, and reactive to light.  Neck:     Vascular: No carotid bruit.  Cardiovascular:     Rate and Rhythm: Normal rate and regular rhythm.     Pulses: Normal pulses.     Heart sounds: Normal heart sounds. No murmur heard.    No friction rub. No gallop.  Pulmonary:     Effort: Pulmonary effort is normal. No respiratory distress.     Breath sounds: Normal breath sounds. No wheezing, rhonchi or rales.  Chest:     Chest wall: No tenderness.  Abdominal:     General: Bowel sounds are normal. There is no distension.     Palpations: Abdomen is soft. There is no mass.     Tenderness: There is no abdominal tenderness. There is no right CVA tenderness, left CVA tenderness, guarding or rebound.  Musculoskeletal:        General: No swelling or tenderness. Normal range of motion.     Cervical back: Normal range of motion. No rigidity or tenderness.     Right lower leg: No edema.     Left lower leg: No edema.  Lymphadenopathy:     Cervical: No cervical adenopathy.  Skin:    General: Skin is warm and dry.     Coloration: Skin is not pale.     Findings: No bruising, erythema, lesion or rash.  Neurological:     Mental Status: She is alert  and oriented to person, place, and time.     Cranial Nerves: No cranial nerve deficit.     Sensory: No sensory deficit.     Motor: No weakness.     Coordination: Coordination normal.     Gait: Gait normal.  Psychiatric:        Mood and Affect: Mood normal.        Speech: Speech normal.        Behavior: Behavior normal.        Thought Content: Thought content normal.        Judgment: Judgment normal.    Labs reviewed: Recent Labs    08/31/22 1545  NA 139  K 4.0  CL  101  CO2 30  GLUCOSE 87  BUN 11  CREATININE 1.00*  CALCIUM 9.6   Recent Labs    08/31/22 1545  AST 17  ALT 9  BILITOT 0.4  PROT 7.7   Recent Labs    08/31/22 1545  WBC 5.7  NEUTROABS 3,146  HGB 13.6  HCT 39.3  MCV 85.6  PLT 377   No results found for: "TSH" No results found for: "HGBA1C" No results found for: "CHOL", "HDL", "LDLCALC", "LDLDIRECT", "TRIG", "CHOLHDL"  Significant Diagnostic Results in last 30 days:  No results found.  Assessment/Plan   Pre-op exam Request EKG done as request by surgeon for upcoming breast reduction procedure to be done on 09/22/2022.also need lab APTT and INR drawn.Not on any anticoagulant. Also request order for bilateral breast ultrasound.No breast pain ,tenderness or discharge reported.Provider call [facility] to inquire on bilateral breast ultrasound was advised that since patient is asymptomatic does not need ultrasound but regular mammogram.MM ordered by Radiology Tech Candise Che appointment given for 09/07/2022 advised to arrive at 12: 20 pm for imaging at 12: 40 pm.Also to notify patient of $ 75 charge for no show.I appreciate Lequita Halt for scheduling patient soon.Provider call patient and notify of above Radiology recommendation patient in agreement will have bilateral breast mammogram 09/07/2022 as scheduled. Breast Center telephone # given [phone]  - EKG 12-Lead indicated normal sinus Rhythm - APTT - Protime-INR  Family/  staff Communication: Reviewed plan of care with patient verbalized understanding.   Labs/tests ordered:  - EKG 12-Lead - APTT - Protime-INR  Next Appointment: Return if symptoms worsen or fail to improve.   Caesar Bookman, NP

## 2022-09-06 ENCOUNTER — Other Ambulatory Visit: Payer: Self-pay | Admitting: Family

## 2022-09-06 ENCOUNTER — Telehealth: Payer: Self-pay | Admitting: Family

## 2022-09-06 DIAGNOSIS — Z1231 Encounter for screening mammogram for malignant neoplasm of breast: Secondary | ICD-10-CM

## 2022-09-06 LAB — PROTIME-INR
INR: 1
Prothrombin Time: 10.6 s (ref 9.0–11.5)

## 2022-09-06 LAB — APTT: aPTT: 29 s (ref 23–32)

## 2022-09-06 NOTE — Telephone Encounter (Signed)
Patient contacted concerning her breast screening that mammogram will be done instead of bilateral ultrasound since she is asymptomatic.Notified appointment will be 09/07/2022 to arrive at 12: 20 pm at [facility] imaging address given.made aware of $ 75 co-pay if no show.Telephone # for imaging center given [phone] patient verbalized understanding will go for mammogram as scheduled.  I appreciate Lequita Halt for scheduling patient soon.

## 2022-09-07 ENCOUNTER — Other Ambulatory Visit: Payer: Self-pay | Admitting: Family

## 2022-09-07 ENCOUNTER — Ambulatory Visit
Admission: RE | Admit: 2022-09-07 | Discharge: 2022-09-07 | Disposition: A | Discharge status: Home / Self Care | Payer: Medicaid Other | Source: Ambulatory Visit | Attending: Family | Admitting: Family

## 2022-09-07 ENCOUNTER — Ambulatory Visit: Payer: Medicaid Other | Admitting: Family

## 2022-09-07 DIAGNOSIS — Z1231 Encounter for screening mammogram for malignant neoplasm of breast: Secondary | ICD-10-CM

## 2022-09-26 ENCOUNTER — Ambulatory Visit (INDEPENDENT_AMBULATORY_CARE_PROVIDER_SITE_OTHER): Payer: Medicaid Other | Admitting: Family

## 2022-09-26 ENCOUNTER — Encounter: Payer: Self-pay | Admitting: Family

## 2022-09-26 VITALS — BP 118/86 | HR 80 | Temp 98.3°F | Resp 16 | Ht 64.0 in | Wt 170.4 lb

## 2022-09-26 DIAGNOSIS — E663 Overweight: Secondary | ICD-10-CM

## 2022-09-26 DIAGNOSIS — Z0289 Encounter for other administrative examinations: Secondary | ICD-10-CM

## 2022-09-26 DIAGNOSIS — Z9889 Other specified postprocedural states: Secondary | ICD-10-CM

## 2022-09-26 NOTE — Patient Instructions (Addendum)
May return to work on 09/27/2022 without any restriction.

## 2022-09-26 NOTE — Progress Notes (Signed)
Provider: Richarda Blade FNP-C  Angelica Farmer, Angelica Citrin, NP  Patient Care Team: Angelica Farmer, Angelica Citrin, NP as PCP - General (Family Medicine)  Extended Emergency Contact Information Primary Emergency Contact: Dilling,Lisa Mobile Phone: [phone] Relation: Mother Secondary Emergency Contact: [facility] Phone: [phone] Relation: Spouse Preferred language: English Interpreter needed? No  Code Status: Full Code Goals of care: Advanced Directive information    09/26/2022    1:45 PM  Advanced Directives  Does Patient Have a Medical Advance Directive? No  Would patient like information on creating a medical advance directive? No - Patient declined     Chief Complaint  Patient presents with   Follow-up    Patient is here for post operation return to work.    HPI:  Pt is a 36 y.o. female seen today for an acute visit for follow up post op and return to work clearance.She states doing well status post outpatient breast reduction.No records for review.she denies any fever,chills,or pain.   Past Medical History:  Diagnosis Date   Hx of abdominoplasty    PCOS (polycystic ovarian syndrome)    Past Surgical History:  Procedure Laterality Date   AUGMENTATION MAMMAPLASTY Bilateral    Implants, 2014   BREAST SURGERY     CESAREAN SECTION      Allergies  Allergen Reactions   Oxycodone Itching    Outpatient Encounter Medications as of 09/26/2022  Medication Sig   Ibuprofen (ADVIL PO) Take 1 tablet by mouth as needed (PRN).   No facility-administered encounter medications on file as of 09/26/2022.    Review of Systems  Constitutional:  Negative for appetite change, chills, fatigue, fever and unexpected weight change.  HENT:  Negative for congestion, dental problem, ear discharge, ear pain, facial swelling, hearing loss, nosebleeds, postnasal drip, rhinorrhea, sinus pressure, sinus pain, sneezing, sore throat, tinnitus and trouble swallowing.   Eyes:  Negative for  pain, discharge, redness, itching and visual disturbance.  Respiratory:  Negative for cough, chest tightness, shortness of breath and wheezing.   Cardiovascular:  Negative for chest pain, palpitations and leg swelling.  Gastrointestinal:  Negative for abdominal distention, abdominal pain, blood in stool, constipation, diarrhea, nausea and vomiting.  Genitourinary:  Negative for difficulty urinating, dysuria, flank pain, frequency and urgency.  Musculoskeletal:  Negative for arthralgias, back pain, gait problem, joint swelling, myalgias, neck pain and neck stiffness.  Skin:  Negative for color change, pallor and rash.       Surgical incision site not observed   Neurological:  Negative for dizziness, syncope, speech difficulty, weakness, light-headedness, numbness and headaches.  Hematological:  Does not bruise/bleed easily.  Psychiatric/Behavioral:  Negative for agitation, behavioral problems, confusion, hallucinations and sleep disturbance. The patient is not nervous/anxious.      There is no immunization history on file for this patient. Pertinent  Health Maintenance Due  Topic Date Due   PAP SMEAR-Modifier  Never done   INFLUENZA VACCINE  Discontinued      04/29/2020   10:56 AM 03/03/2021   10:46 PM 08/31/2022    2:31 PM 09/05/2022    2:42 PM 09/26/2022    1:45 PM  Fall Risk  Falls in the past year?   0 0 0  Was there an injury with Fall?   0 0 0  Fall Risk Category Calculator   0 0 0  Fall Risk Category   Low Low Low  Patient Fall Risk Level Low fall risk Low fall risk Low fall risk Low fall risk Low fall risk  Patient at Risk for Falls Due to   No Fall Risks No Fall Risks No Fall Risks  Fall risk Follow up   Falls evaluation completed Falls evaluation completed Falls evaluation completed   Functional Status Survey:    Vitals:   09/26/22 1343  BP: 118/86  Pulse: 80  Resp: 16  Temp: 98.3 F (36.8 C)  SpO2: 98%  Weight: 170 lb 6.4 oz (77.3 kg)  Height: 5\' 4"  (1.626 m)    Body mass index is 29.25 kg/m. Physical Exam Vitals reviewed.  Constitutional:      General: She is not in acute distress.    Appearance: Normal appearance. She is overweight. She is not ill-appearing or diaphoretic.  HENT:     Head: Normocephalic.     Mouth/Throat:     Mouth: Mucous membranes are moist.     Pharynx: Oropharynx is clear. No oropharyngeal exudate or posterior oropharyngeal erythema.  Eyes:     General: No scleral icterus.       Right eye: No discharge.        Left eye: No discharge.     Conjunctiva/sclera: Conjunctivae normal.     Pupils: Pupils are equal, round, and reactive to light.  Neck:     Vascular: No carotid bruit.  Cardiovascular:     Rate and Rhythm: Normal rate and regular rhythm.     Pulses: Normal pulses.     Heart sounds: Normal heart sounds. No murmur heard.    No friction rub. No gallop.  Pulmonary:     Effort: Pulmonary effort is normal. No respiratory distress.     Breath sounds: Normal breath sounds. No wheezing, rhonchi or rales.  Chest:     Chest wall: No tenderness.  Abdominal:     General: Bowel sounds are normal. There is no distension.     Palpations: Abdomen is soft. There is no mass.     Tenderness: There is no abdominal tenderness. There is no right CVA tenderness, left CVA tenderness, guarding or rebound.  Musculoskeletal:        General: No swelling or tenderness. Normal range of motion.     Cervical back: Normal range of motion. No rigidity or tenderness.     Right lower leg: No edema.     Left lower leg: No edema.  Lymphadenopathy:     Cervical: No cervical adenopathy.  Skin:    General: Skin is warm and dry.     Coloration: Skin is not pale.     Findings: No bruising, erythema, lesion or rash.  Neurological:     Mental Status: She is alert and oriented to person, place, and time.     Cranial Nerves: No cranial nerve deficit.     Sensory: No sensory deficit.     Motor: No weakness.     Coordination: Coordination  normal.     Gait: Gait normal.  Psychiatric:        Mood and Affect: Mood normal.        Speech: Speech normal.        Behavior: Behavior normal.        Thought Content: Thought content normal.        Judgment: Judgment normal.     Labs reviewed: Recent Labs    08/31/22 1545  NA 139  K 4.0  CL 101  CO2 30  GLUCOSE 87  BUN 11  CREATININE 1.00*  CALCIUM 9.6   Recent Labs    08/31/22 1545  AST  17  ALT 9  BILITOT 0.4  PROT 7.7   Recent Labs    08/31/22 1545  WBC 5.7  NEUTROABS 3,146  HGB 13.6  HCT 39.3  MCV 85.6  PLT 377   No results found for: "TSH" No results found for: "HGBA1C" No results found for: "CHOL", "HDL", "LDLCALC", "LDLDIRECT", "TRIG", "CHOLHDL"  Significant Diagnostic Results in last 30 days:  MM 3D SCREEN BREAST W/IMPLANT BILATERAL  Result Date: 09/12/2022 CLINICAL DATA:  Screening. EXAM: DIGITAL SCREENING BILATERAL MAMMOGRAM WITH IMPLANTS, CAD AND TOMOSYNTHESIS TECHNIQUE: Bilateral screening digital craniocaudal and mediolateral oblique mammograms were obtained. Bilateral screening digital breast tomosynthesis was performed. The images were evaluated with computer-aided detection. Standard and/or implant displaced views were performed. COMPARISON:  None available. ACR Breast Density Category b: There are scattered areas of fibroglandular density. FINDINGS: The patient has prepectoral implants. There are no findings suspicious for malignancy. IMPRESSION: No mammographic evidence of malignancy. A result letter of this screening mammogram will be mailed directly to the patient. RECOMMENDATION: Screening mammogram at age 60. (Code:SM-B-40A) BI-RADS CATEGORY  1: Negative. Electronically Signed   By: Sande Brothers M.D.   On: 09/12/2022 13:52   Assessment/Plan  1. Encounter for completion of form with patient Work form completed.May return to work 09/27/2022 without any restriction post bilateral breast reduction.  2. Status post bilateral breast  reduction Afebrile No pain reported - follow up with surgeon as directed  - Notify provider any fever,chills,redness,swelling or drainage from incision   3. Overweight with body mass index (BMI) 25.0-29.9 BMI 29.25  Dietary modification and exercise advised  Family/ staff Communication: Reviewed plan of care with patient verbalized and standing  Labs/tests ordered: None   Next Appointment: Return if symptoms worsen or fail to improve.  Caesar Bookman, NP

## 2022-09-27 ENCOUNTER — Telehealth: Payer: Self-pay | Admitting: Family

## 2022-09-27 NOTE — Telephone Encounter (Signed)
FMLA paperwork completed please call patient to pickup paper work.

## 2022-10-27 ENCOUNTER — Encounter: Payer: Medicaid Other | Admitting: Family

## 2022-10-31 ENCOUNTER — Encounter: Payer: Medicaid Other | Admitting: Family

## 2023-03-17 ENCOUNTER — Ambulatory Visit (INDEPENDENT_AMBULATORY_CARE_PROVIDER_SITE_OTHER): Payer: BC Managed Care – PPO | Admitting: Adult Health

## 2023-03-17 ENCOUNTER — Encounter: Payer: Self-pay | Admitting: Adult Health

## 2023-03-17 ENCOUNTER — Ambulatory Visit: Payer: Medicaid Other | Admitting: Family

## 2023-03-17 VITALS — BP 121/80 | HR 76 | Temp 97.9°F | Resp 18 | Ht 64.0 in | Wt 173.8 lb

## 2023-03-17 DIAGNOSIS — F419 Anxiety disorder, unspecified: Secondary | ICD-10-CM | POA: Diagnosis not present

## 2023-03-17 DIAGNOSIS — F5101 Primary insomnia: Secondary | ICD-10-CM

## 2023-03-17 DIAGNOSIS — R002 Palpitations: Secondary | ICD-10-CM

## 2023-03-17 MED ORDER — LORAZEPAM 0.5 MG PO TABS: 0.5000 mg | ORAL_TABLET | Freq: Every evening | ORAL | 0 refills | Status: DC | PRN

## 2023-03-17 NOTE — Patient Instructions (Signed)

## 2023-03-17 NOTE — Progress Notes (Unsigned)
[facility] clinic  Provider:   Code Status: *** Goals of Care:     03/17/2023    1:28 PM  Advanced Directives  Does Patient Have a Medical Advance Directive? No  Would patient like information on creating a medical advance directive? No - Patient declined     Chief Complaint  Patient presents with   Acute Visit    Unable to sleep     HPI: Patient is a 37 y.o. female seen today for an acute visit for   Wt Readings from Last 3 Encounters:  03/17/23 173 lb 12.8 oz (78.8 kg)  09/26/22 170 lb 6.4 oz (77.3 kg)  09/05/22 174 lb (78.9 kg)     Past Medical History:  Diagnosis Date   Hx of abdominoplasty    PCOS (polycystic ovarian syndrome)     Past Surgical History:  Procedure Laterality Date   AUGMENTATION MAMMAPLASTY Bilateral    Implants, 2014   BREAST SURGERY     CESAREAN SECTION      Allergies  Allergen Reactions   Oxycodone Itching    Outpatient Encounter Medications as of 03/17/2023  Medication Sig   Ibuprofen (ADVIL PO) Take 1 tablet by mouth as needed (PRN).   No facility-administered encounter medications on file as of 03/17/2023.    Review of Systems:  Review of Systems  Constitutional:  Negative for appetite change, chills, fatigue and fever.  HENT:  Negative for congestion, hearing loss, rhinorrhea and sore throat.   Eyes: Negative.   Respiratory:  Negative for cough, shortness of breath and wheezing.   Cardiovascular:  Negative for chest pain, palpitations and leg swelling.  Gastrointestinal:  Negative for abdominal pain, constipation, diarrhea, nausea and vomiting.  Genitourinary:  Negative for dysuria.  Musculoskeletal:  Negative for arthralgias, back pain and myalgias.  Skin:  Negative for color change, rash and wound.  Neurological:  Negative for dizziness, weakness and headaches.  Psychiatric/Behavioral:  Negative for behavioral problems. The patient is not nervous/anxious.     Health Maintenance  Topic Date Due   DTaP/Tdap/Td (1 - Tdap)  Never done   PAP SMEAR-Modifier  Never done   Hepatitis C Screening  Completed   HIV Screening  Completed   HPV VACCINES  Aged Out   INFLUENZA VACCINE  Discontinued   COVID-19 Vaccine  Discontinued    Physical Exam: Vitals:   03/17/23 1329  BP: 121/80  Pulse: 76  Resp: 18  Temp: 97.9 F (36.6 C)  SpO2: 99%  Weight: 173 lb 12.8 oz (78.8 kg)  Height: 5\' 4"  (1.626 m)   Body mass index is 29.83 kg/m. Physical Exam Constitutional:      Appearance: Normal appearance.  HENT:     Head: Normocephalic and atraumatic.     Nose: Nose normal.     Mouth/Throat:     Mouth: Mucous membranes are moist.  Eyes:     Conjunctiva/sclera: Conjunctivae normal.  Cardiovascular:     Rate and Rhythm: Normal rate and regular rhythm.  Pulmonary:     Effort: Pulmonary effort is normal.     Breath sounds: Normal breath sounds.  Abdominal:     General: Bowel sounds are normal.     Palpations: Abdomen is soft.  Musculoskeletal:        General: Normal range of motion.     Cervical back: Normal range of motion.  Skin:    General: Skin is warm and dry.  Neurological:     General: No focal deficit present.  Mental Status: She is alert and oriented to person, place, and time.  Psychiatric:        Mood and Affect: Mood normal.        Behavior: Behavior normal.        Thought Content: Thought content normal.        Judgment: Judgment normal.     Labs reviewed: Basic Metabolic Panel: Recent Labs    08/31/22 1545  NA 139  K 4.0  CL 101  CO2 30  GLUCOSE 87  BUN 11  CREATININE 1.00*  CALCIUM 9.6   Liver Function Tests: Recent Labs    08/31/22 1545  AST 17  ALT 9  BILITOT 0.4  PROT 7.7   No results for input(s): "LIPASE", "AMYLASE" in the last 8760 hours. No results for input(s): "AMMONIA" in the last 8760 hours. CBC: Recent Labs    08/31/22 1545  WBC 5.7  NEUTROABS 3,146  HGB 13.6  HCT 39.3  MCV 85.6  PLT 377   Lipid Panel: No results for input(s): "CHOL", "HDL",  "LDLCALC", "TRIG", "CHOLHDL", "LDLDIRECT" in the last 8760 hours. No results found for: "HGBA1C"  Procedures since last visit: No results found.  Assessment/Plan     Labs/tests ordered:    Next appt:  Visit date not found

## 2023-04-25 ENCOUNTER — Ambulatory Visit: Payer: BC Managed Care – PPO | Admitting: Family

## 2023-04-27 ENCOUNTER — Ambulatory Visit: Payer: BC Managed Care – PPO | Admitting: Family

## 2023-05-02 ENCOUNTER — Ambulatory Visit (INDEPENDENT_AMBULATORY_CARE_PROVIDER_SITE_OTHER): Payer: BC Managed Care – PPO | Admitting: Family

## 2023-05-02 ENCOUNTER — Encounter: Payer: Self-pay | Admitting: Family

## 2023-05-02 VITALS — BP 140/78 | HR 73 | Temp 97.7°F | Resp 18 | Ht 64.0 in | Wt 172.0 lb

## 2023-05-02 DIAGNOSIS — R002 Palpitations: Secondary | ICD-10-CM | POA: Diagnosis not present

## 2023-05-02 DIAGNOSIS — L659 Nonscarring hair loss, unspecified: Secondary | ICD-10-CM

## 2023-05-02 DIAGNOSIS — F411 Generalized anxiety disorder: Secondary | ICD-10-CM | POA: Diagnosis not present

## 2023-05-02 MED ORDER — SERTRALINE HCL 25 MG PO TABS: 25.0000 mg | ORAL_TABLET | Freq: Every day | ORAL | 3 refills | Status: DC

## 2023-05-02 NOTE — Progress Notes (Signed)
Provider: Richarda Blade FNP-C  Deejay Koppelman, [name], NP  Patient Care Team: Rizwan Kuyper, [name], NP as PCP - General (Family Medicine)  Extended Emergency Contact Information Primary Emergency Contact: Moorman,Lisa Mobile Phone: [phone] Relation: Mother Secondary Emergency Contact: [facility] Phone: [phone] Relation: Spouse Preferred language: English Interpreter needed? No  Code Status:  Full Code  Goals of care: Advanced Directive information    03/17/2023    1:28 PM  Advanced Directives  Does Patient Have a Medical Advance Directive? No  Would patient like information on creating a medical advance directive? No - Patient declined     Chief Complaint  Patient presents with   Acute Visit    Patient is here because medication for anxiety is not working    HPI:  Pt is a 37 y.o. 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 Has had palpitation.she trying to look for another job to get out of the increased stress level.Also currently in the process of trying to buy a house. Also complains of losing hair on both temporal areas.Recently started applying Rosemary/coconut plus another oil to regrown her hair.wonders whether anxiety or stress level contributing to her hair loss.   Past Medical History:  Diagnosis Date   Hx of abdominoplasty    PCOS (polycystic ovarian syndrome)    Past Surgical History:  Procedure Laterality Date   AUGMENTATION  MAMMAPLASTY Bilateral    Implants, 2014   BREAST SURGERY     CESAREAN SECTION      Allergies  Allergen Reactions   Oxycodone Itching    Outpatient Encounter Medications as of 05/02/2023  Medication Sig   Ibuprofen (ADVIL PO) Take 1 tablet by mouth as needed (PRN).   LORazepam (ATIVAN) 0.5 MG tablet Take 1 tablet (0.5 mg total) by mouth at bedtime as needed for anxiety.   No facility-administered encounter medications on file as of 05/02/2023.    Review of Systems  Constitutional:  Negative for appetite change, chills, fatigue, fever and unexpected weight change.  HENT:  Negative for congestion, dental problem, ear discharge, ear pain, facial swelling, hearing loss, nosebleeds, postnasal drip, rhinorrhea, sinus pressure, sinus pain, sneezing, sore throat, tinnitus and trouble swallowing.   Eyes:  Negative for pain, discharge, redness, itching and visual disturbance.  Respiratory:  Negative for cough, chest tightness, shortness of breath and wheezing.   Cardiovascular:  Negative for chest pain, palpitations and leg swelling.       Palpitation due to anxiety   Gastrointestinal:  Negative for abdominal distention, abdominal pain, blood in stool, constipation, diarrhea, nausea and vomiting.  Endocrine: Negative for cold intolerance, heat intolerance, polydipsia, polyphagia and polyuria.  Genitourinary:  Negative for difficulty urinating, dysuria, flank pain, frequency and urgency.  Musculoskeletal:  Negative for arthralgias, back pain, gait problem, joint swelling, myalgias, neck pain and neck stiffness.  Skin:  Negative for color change, pallor, rash and wound.  Neurological:  Negative for dizziness, syncope, speech difficulty, weakness, light-headedness, numbness  and headaches.  Hematological:  Does not bruise/bleed easily.  Psychiatric/Behavioral:  Negative for agitation, behavioral problems, confusion, hallucinations, self-injury, sleep disturbance and suicidal ideas. The patient is  nervous/anxious.      There is no immunization history on file for this patient. Pertinent  Health Maintenance Due  Topic Date Due   PAP SMEAR-Modifier  Never done   INFLUENZA VACCINE  Discontinued      03/03/2021   10:46 PM 08/31/2022    2:31 PM 09/05/2022    2:42 PM 09/26/2022    1:45 PM 03/17/2023    1:28 PM  Fall Risk  Falls in the past year?  0 0 0 0  Was there an injury with Fall?  0 0 0 0  Fall Risk Category Calculator  0 0 0 0  Fall Risk Category (Retired)  Low Low Low   (RETIRED) Patient Fall Risk Level Low fall risk Low fall risk Low fall risk Low fall risk   Patient at Risk for Falls Due to  No Fall Risks No Fall Risks No Fall Risks No Fall Risks  Fall risk Follow up  Falls evaluation completed Falls evaluation completed Falls evaluation completed Falls evaluation completed   Functional Status Survey:    Vitals:   05/02/23 1423 05/02/23 1435  BP: (!) 140/80 (!) 140/78  Pulse:  73  Resp:  18  Temp:  97.7 F (36.5 C)  SpO2:  98%  Weight:  172 lb (78 kg)  Height: 5\' 4"  (1.626 m) 5\' 4"  (1.626 m)   Body mass index is 29.52 kg/m. Physical Exam Vitals reviewed.  Constitutional:      General: She is not in acute distress.    Appearance: Normal appearance. She is overweight. She is not ill-appearing or diaphoretic.  HENT:     Head: Normocephalic.     Right Ear: Tympanic membrane, ear canal and external ear normal. There is no impacted cerumen.     Left Ear: Tympanic membrane, ear canal and external ear normal. There is no impacted cerumen.     Nose: Nose normal. No congestion or rhinorrhea.     Mouth/Throat:     Mouth: Mucous membranes are moist.     Pharynx: Oropharynx is clear. No oropharyngeal exudate or posterior oropharyngeal erythema.  Eyes:     General: No scleral icterus.       Right eye: No discharge.        Left eye: No discharge.     Extraocular Movements: Extraocular movements intact.     Conjunctiva/sclera: Conjunctivae normal.     Pupils:  Pupils are equal, round, and reactive to light.  Neck:     Vascular: No carotid bruit.  Cardiovascular:     Rate and Rhythm: Normal rate and regular rhythm.     Pulses: Normal pulses.     Heart sounds: Normal heart sounds. No murmur heard.    No friction rub. No gallop.  Pulmonary:     Effort: Pulmonary effort is normal. No respiratory distress.     Breath sounds: Normal breath sounds. No wheezing, rhonchi or rales.  Chest:     Chest wall: No tenderness.  Abdominal:     General: Bowel sounds are normal. There is no distension.     Palpations: Abdomen is soft. There is no mass.     Tenderness: There is no abdominal tenderness. There is no right CVA tenderness, left CVA tenderness, guarding or rebound.  Musculoskeletal:        General: No swelling  or tenderness. Normal range of motion.     Cervical back: Normal range of motion. No rigidity or tenderness.     Right lower leg: No edema.     Left lower leg: No edema.  Lymphadenopathy:     Cervical: No cervical adenopathy.  Skin:    General: Skin is warm and dry.     Coloration: Skin is not pale.     Findings: No bruising, erythema, lesion or rash.  Neurological:     Mental Status: She is alert and oriented to person, place, and time.     Cranial Nerves: No cranial nerve deficit.     Sensory: No sensory deficit.     Motor: No weakness.     Coordination: Coordination normal.     Gait: Gait normal.  Psychiatric:        Mood and Affect: Mood normal.        Speech: Speech normal.        Behavior: Behavior normal.        Thought Content: Thought content normal.        Judgment: Judgment normal.     Labs reviewed: Recent Labs    08/31/22 1545  NA 139  K 4.0  CL 101  CO2 30  GLUCOSE 87  BUN 11  CREATININE 1.00*  CALCIUM 9.6   Recent Labs    08/31/22 1545  AST 17  ALT 9  BILITOT 0.4  PROT 7.7   Recent Labs    08/31/22 1545  WBC 5.7  NEUTROABS 3,146  HGB 13.6  HCT 39.3  MCV 85.6  PLT 377   No results found  for: "TSH" No results found for: "HGBA1C" No results found for: "CHOL", "HDL", "LDLCALC", "LDLDIRECT", "TRIG", "CHOLHDL"  Significant Diagnostic Results in last 30 days:  No results found.  Assessment/Plan 1. Generalized anxiety disorder Reports increased work stress level  - continue on Ativan 0.5 mg at bedtime PRN  - discussed starting on Sertraline as below.SE discussed  - sertraline (ZOLOFT) 25 MG tablet; Take 1 tablet (25 mg total) by mouth daily.  Dispense: 30 tablet; Refill: 3 - follow up in one month for evaluation will increase sertraline to 50 mg if still ineffective  - TSH  2. Palpitations Suspect due to anxiety but will rule out thyroid issues.  - TSH  3. Hair loss Has had bilateral temporal hair loss.unclear etiology but suspect due to holding hair in ponytail or due to increased stress level.will rule out thyroid issues.     Family/ staff Communication: Reviewed plan of care with patient  Labs/tests ordered:  - TSH  Next Appointment: Return in about 1 month (around 06/02/2023) for anxiety.   Caesar Bookman, NP

## 2023-05-03 LAB — TSH: TSH: 1.7 mIU/L

## 2023-06-05 ENCOUNTER — Encounter: Payer: BC Managed Care – PPO | Admitting: Family

## 2023-06-11 NOTE — Progress Notes (Signed)
  This encounter was created in error - please disregard. No show 

## 2023-07-05 ENCOUNTER — Encounter: Payer: Self-pay | Admitting: Orthopaedic Surgery

## 2023-07-05 ENCOUNTER — Other Ambulatory Visit (INDEPENDENT_AMBULATORY_CARE_PROVIDER_SITE_OTHER): Payer: BC Managed Care – PPO

## 2023-07-05 ENCOUNTER — Ambulatory Visit (INDEPENDENT_AMBULATORY_CARE_PROVIDER_SITE_OTHER): Payer: BC Managed Care – PPO | Admitting: Orthopaedic Surgery

## 2023-07-05 DIAGNOSIS — G8929 Other chronic pain: Secondary | ICD-10-CM

## 2023-07-05 DIAGNOSIS — M545 Low back pain, unspecified: Secondary | ICD-10-CM | POA: Diagnosis not present

## 2023-07-05 MED ORDER — NAPROXEN 500 MG PO TABS: 500.0000 mg | ORAL_TABLET | Freq: Two times a day (BID) | ORAL | 3 refills | Status: DC

## 2023-07-05 MED ORDER — PREDNISONE 10 MG (21) PO TBPK: ORAL_TABLET | ORAL | 3 refills | Status: DC

## 2023-07-05 MED ORDER — CYCLOBENZAPRINE HCL 5 MG PO TABS: 5.0000 mg | ORAL_TABLET | Freq: Every evening | ORAL | 3 refills | Status: DC | PRN

## 2023-07-05 NOTE — Progress Notes (Signed)
Office Visit Note   Patient: Angelica Farmer           Date of Birth: 08-11-1986           MRN: [id number] Visit Date: 07/05/2023              Requested by: Caesar Bookman, NP [address] [place],  [place] [address] PCP: Caesar Bookman, NP   Assessment & Plan: Visit Diagnoses:  1. Chronic midline low back pain without sciatica     Plan: Gurbani is a 37 year old female with low back pain from facet disease.  Not reporting any radicular symptoms.  Prescriptions have been sent in and I have made a referral to outpatient physical therapy.  Questions encouraged and answered.  Follow-up as needed.  Follow-Up Instructions: No follow-ups on file.   Orders:  Orders Placed This Encounter  Procedures   XR Lumbar Spine 2-3 Views   Ambulatory referral to Physical Therapy   Meds ordered this encounter  Medications   cyclobenzaprine (FLEXERIL) 5 MG tablet    Sig: Take 1-2 tablets (5-10 mg total) by mouth at bedtime as needed for muscle spasms.    Dispense:  30 tablet    Refill:  3   predniSONE (STERAPRED UNI-PAK 21 TAB) 10 MG (21) TBPK tablet    Sig: Take as directed    Dispense:  21 tablet    Refill:  3   naproxen (NAPROSYN) 500 MG tablet    Sig: Take 1 tablet (500 mg total) by mouth 2 (two) times daily with a meal.    Dispense:  30 tablet    Refill:  3      Procedures: No procedures performed   Clinical Data: No additional findings.   Subjective: Chief Complaint  Patient presents with   Lower Back - Pain    HPI Patient is a pleasant 37 year old female who works as a Corporate treasurer at the [employer] who comes in for low back pain and stiffness for 2 months.  Denies any radicular symptoms.  Denies any injuries.  States that sitting and wearing equipment around the waist makes the symptoms worse.  Denies any red flag symptoms. Review of Systems  Constitutional: Negative.   HENT: Negative.    Eyes: Negative.   Respiratory: Negative.     Cardiovascular: Negative.   Endocrine: Negative.   Musculoskeletal: Negative.   Neurological: Negative.   Hematological: Negative.   Psychiatric/Behavioral: Negative.    All other systems reviewed and are negative.    Objective: Vital Signs: There were no vitals taken for this visit.  Physical Exam Vitals and nursing note reviewed.  Constitutional:      Appearance: She is well-developed.  HENT:     Head: Atraumatic.     Nose: Nose normal.  Eyes:     Extraocular Movements: Extraocular movements intact.  Cardiovascular:     Pulses: Normal pulses.  Pulmonary:     Effort: Pulmonary effort is normal.  Abdominal:     Palpations: Abdomen is soft.  Musculoskeletal:     Cervical back: Neck supple.  Skin:    General: Skin is warm.     Capillary Refill: Capillary refill takes less than 2 seconds.  Neurological:     Mental Status: She is alert. Mental status is at baseline.  Psychiatric:        Behavior: Behavior normal.        Thought Content: Thought content normal.  Judgment: Judgment normal.     Ortho Exam Examination lumbar spine is nonfocal.  She has moderate tightness of the hamstrings bilaterally.  Negative Faber.  Hip exams are unremarkable. Specialty Comments:  No specialty comments available.  Imaging: XR Lumbar Spine 2-3 Views  Result Date: 07/05/2023 X-rays of the lumbar spine show no acute or structural abnormalities.  There is a minor amount of facet arthropathy.    PMFS History: Patient Active Problem List   Diagnosis Date Noted   Abnormal chromosomal and genetic finding on antenatal screening mother 05/29/2020   Antepartum anemia complicating pregnancy 05/29/2020   History of abdominoplasty 05/29/2020   History of placenta previa 05/29/2020   Hx of cesarean section 05/29/2020   Supervision of other high-risk pregnancy 05/29/2020   Past Medical History:  Diagnosis Date   Hx of abdominoplasty    PCOS (polycystic ovarian syndrome)      Family History  Problem Relation Age of Onset   Breast cancer Maternal Grandmother    Cancer Maternal Grandmother    Cancer Maternal Grandfather     Past Surgical History:  Procedure Laterality Date   AUGMENTATION MAMMAPLASTY Bilateral    Implants, 2014   BREAST SURGERY     CESAREAN SECTION     Social History   Occupational History   Not on file  Tobacco Use   Smoking status: Never   Smokeless tobacco: Never  Vaping Use   Vaping status: Never Used  Substance and Sexual Activity   Alcohol use: Yes    Comment: occ   Drug use: Never   Sexual activity: Yes    Birth control/protection: None

## 2023-07-17 ENCOUNTER — Ambulatory Visit: Payer: BC Managed Care – PPO

## 2023-07-21 ENCOUNTER — Ambulatory Visit: Payer: BC Managed Care – PPO | Admitting: Physical Therapy

## 2023-08-04 ENCOUNTER — Ambulatory Visit: Payer: BC Managed Care – PPO | Admitting: Physical Therapy

## 2023-08-07 ENCOUNTER — Encounter: Payer: BC Managed Care – PPO | Admitting: Family

## 2023-08-08 ENCOUNTER — Ambulatory Visit: Payer: BC Managed Care – PPO | Admitting: Family

## 2023-08-09 NOTE — Progress Notes (Signed)
  This encounter was created in error - please disregard. No show 

## 2023-08-11 ENCOUNTER — Ambulatory Visit: Payer: BC Managed Care – PPO | Attending: Family | Admitting: Physical Therapy

## 2023-08-11 ENCOUNTER — Encounter: Payer: Self-pay | Admitting: Physical Therapy

## 2023-08-11 ENCOUNTER — Other Ambulatory Visit: Payer: Self-pay

## 2023-08-11 DIAGNOSIS — M5459 Other low back pain: Secondary | ICD-10-CM | POA: Diagnosis present

## 2023-08-11 DIAGNOSIS — M545 Low back pain, unspecified: Secondary | ICD-10-CM | POA: Diagnosis not present

## 2023-08-11 DIAGNOSIS — G8929 Other chronic pain: Secondary | ICD-10-CM | POA: Insufficient documentation

## 2023-08-11 DIAGNOSIS — M6281 Muscle weakness (generalized): Secondary | ICD-10-CM | POA: Insufficient documentation

## 2023-08-11 NOTE — Therapy (Signed)
OUTPATIENT PHYSICAL THERAPY THORACOLUMBAR EVALUATION  Patient Name: Angelica Farmer MRN: [id number] DOB:08/09/1986, 37 y.o., female Today's Date: 08/11/2023   PT End of Session - 08/11/23 0927     Visit Number 1    Number of Visits --   1-2x/week   Date for PT Re-Evaluation 10/06/23    Authorization Type [employer] - FOTO    PT Start Time 0900    PT Stop Time 0930    PT Time Calculation (min) 30 min             Past Medical History:  Diagnosis Date   Hx of abdominoplasty    PCOS (polycystic ovarian syndrome)    Past Surgical History:  Procedure Laterality Date   AUGMENTATION MAMMAPLASTY Bilateral    Implants, 2014   BREAST SURGERY     CESAREAN SECTION     Patient Active Problem List   Diagnosis Date Noted   Abnormal chromosomal and genetic finding on antenatal screening mother 05/29/2020   Antepartum anemia complicating pregnancy 05/29/2020   History of abdominoplasty 05/29/2020   History of placenta previa 05/29/2020   Hx of cesarean section 05/29/2020   Supervision of other high-risk pregnancy 05/29/2020    PCP: Ngetich, [name], NP  REFERRING PROVIDER: Ngetich, [name], NP  THERAPY DIAG:  Other low back pain - Plan: PT plan of care cert/re-cert  Muscle weakness - Plan: PT plan of care cert/re-cert  REFERRING DIAG: low back pain  Rationale for Evaluation and Treatment:  Rehabilitation  SUBJECTIVE:  PERTINENT PAST HISTORY:  C section 2022       PRECAUTIONS: None  WEIGHT BEARING RESTRICTIONS No  FALLS:  Has patient fallen in last 6 months? No, Number of falls: 0  MOI/History of condition:  Onset date: 3 months significantly worse  SUBJECTIVE STATEMENT  Angelica Farmer is a 37 y.o. female who presents to clinic with chief complaint of low back pain.  She had a bout of low back pain following he birth in 2022 but this did abate.  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  It has  increased in severity in the last 3 months. No significant change in activity over the last 3 months.  No radicular sxs.   Red flags:  denies   Pain:  Are you having pain? 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   There is a minor amount of facet arthropathy.   GENERAL OBSERVATION/GAIT:  NA  SENSATION:  Light touch: Appears intact  LUMBAR AROM  AROM AROM  (Eval)  Flexion Fingertips to knees, w/ concordant pain  Extension WNL  Right lateral flexion WNL  Left lateral flexion WNL  Right rotation WNL  Left rotation WNL    (Blank rows = not tested)   LE MMT:  MMT Right (Eval) Left (Eval)  Hip flexion (L2, L3) 4+ 4+  Knee extension (L3)    Knee flexion    Hip abduction 3+* 4*  Hip extension 3** 3+*  Hip external rotation 4  4  Hip internal rotation 4 4  Hip adduction    Ankle dorsiflexion (L4)    Ankle plantarflexion (S1)    Ankle inversion    Ankle eversion    Great Toe ext (L5)    Grossly     (Blank rows = not tested, score listed is out of 5 possible points.  N = WNL, D = diminished, C = clear for gross weakness with myotome testing, * = concordant pain with testing)   MUSCLE LENGTH: Hamstrings: Right significant restriction; Left significant restriction ASLR: Right ASLR = PSLR; Left ASLR = PSLR Thomas test: Right significant restriction; Left significant restriction  LE ROM:  ROM Right (Eval) Left (Eval)  Hip flexion    Hip extension    Hip abduction     Hip adduction    Hip internal rotation    Hip external rotation    Knee flexion    Knee extension    Ankle dorsiflexion    Ankle plantarflexion    Ankle inversion    Ankle eversion      (Blank rows = not tested, N = WNL, * = concordant pain with testing)  Functional Tests  Eval (08/11/2023)                                                                PALPATION:   Minimal TTP low back, hypomobile lumbar spine  PATIENT SURVEYS:  FOTO 72 -> 78   TODAY'S TREATMENT  Creating, reviewing, and completing below HEP  PATIENT EDUCATION:  POC, diagnosis, prognosis, HEP, and outcome measures.  Pt educated via explanation, demonstration, and handout (HEP).  Pt confirms understanding verbally.   HOME EXERCISE PROGRAM: Access Code: [email or web address] URL: [email or web address] Date: 08/11/2023 Prepared by: Alphonzo Severance  Exercises - Supine Lower Trunk Rotation  - 1 x daily - 7 x weekly - 1 sets - 20 reps - 3 hold - Supine Posterior Pelvic Tilt  - 2 x daily - 7 x weekly - 2 sets - 10 reps - 5'' hold - Supine Hip Adduction Isometric with Ball  - 1 x daily - 7 x weekly - 2 sets - 10 reps - 10'' hold - Hooklying Isometric Clamshell  - 1 x daily - 7 x weekly - 3 sets - 10 reps - Seated Hamstring Stretch  - 2 x daily - 7 x weekly - 1 sets - 3 reps - 45 hold  Treatment priorities   Eval        Bil HS and hip flexor stretch        Core and hip strength        Forward flexion         Hip abd and ext strength                  ASSESSMENT:  CLINICAL IMPRESSION: Calin is a 37 y.o. female who presents to clinic with signs and sxs consistent with mechanical low back pain.  X-rays clear for significant pathology.  No radicular sxs.  Pt will benefit from skilled therapy to address relevant deficits and reduce pain with daily activities including bending, lifting, and work.    OBJECTIVE IMPAIRMENTS: Pain, lumbar ROM, hip and core strength  ACTIVITY LIMITATIONS:  bending, lifting, work  PERSONAL FACTORS: See  medical history and pertinent history   REHAB POTENTIAL: Good  CLINICAL DECISION MAKING: Stable/uncomplicated  EVALUATION COMPLEXITY: Low   GOALS:   SHORT TERM GOALS: Target date: 09/08/2023   Tangi will be >75% HEP compliant to improve carryover between sessions and facilitate independent management of condition  Evaluation: ongoing Goal status: INITIAL   LONG TERM GOALS: Target date: 10/06/2023   Madlyn will improve FOTO score to 78 as a proxy for functional improvement  Evaluation/Baseline: 72 Goal status: INITIAL    2.  Karalyn will self report >/= 50% decrease in pain from evaluation to improve function in daily tasks  Evaluation/Baseline: 6/10 max pain Goal status: INITIAL   3.  Graceyn will show significant improvement in HS length bil  Evaluation/Baseline: significant limitation bil Goal status: INITIAL   4.  Bria will be able to flex lumbar spine to reach mid shins, not limited by pain  Evaluation/Baseline: knees limited by pain and stiffness Goal status: INITIAL   5.  Saxon will report confidence in self management of condition at time of discharge with advanced HEP  Evaluation/Baseline: unable to self manage Goal status: INITIAL   6.  Corita will improve the following MMTs to >/= 4/5 to show improvement in strength:  hip abd and ext strength   Evaluation/Baseline: see chart in note Goal status: INITIAL   PLAN: PT FREQUENCY: 1-2x/week  PT DURATION: 8 weeks  PLANNED INTERVENTIONS:  97164- PT Re-evaluation, 97110-Therapeutic exercises, 97530- Therapeutic activity, [id number]- Neuromuscular re-education, 97535- Self Care, 13244- Manual therapy, [id number]- Gait training, [id number]- Aquatic Therapy, [phone]- Electrical stimulation (manual), [id number]- Vasopneumatic device, [id number]- Traction (mechanical), [id number]- Ionotophoresis 4mg /ml Dexamethasone, Taping, Dry Needling, Joint manipulation, and Spinal  manipulation.   Alphonzo Severance PT, DPT 08/11/2023, 9:39 AM

## 2023-08-23 ENCOUNTER — Ambulatory Visit: Payer: BC Managed Care – PPO

## 2023-08-29 ENCOUNTER — Ambulatory Visit: Payer: BC Managed Care – PPO

## 2023-11-08 ENCOUNTER — Encounter: Payer: Self-pay | Admitting: Obstetrics & Gynecology

## 2023-11-08 ENCOUNTER — Other Ambulatory Visit (HOSPITAL_COMMUNITY)
Admission: RE | Admit: 2023-11-08 | Discharge: 2023-11-08 | Disposition: A | Discharge status: Home / Self Care | Payer: Medicaid Other | Source: Ambulatory Visit | Attending: Obstetrics & Gynecology | Admitting: Obstetrics & Gynecology

## 2023-11-08 ENCOUNTER — Ambulatory Visit: Payer: Medicaid Other | Admitting: Obstetrics & Gynecology

## 2023-11-08 VITALS — BP 131/86 | HR 75 | Ht 64.0 in | Wt 177.8 lb

## 2023-11-08 DIAGNOSIS — Z01419 Encounter for gynecological examination (general) (routine) without abnormal findings: Secondary | ICD-10-CM | POA: Insufficient documentation

## 2023-11-08 DIAGNOSIS — Z30018 Encounter for initial prescription of other contraceptives: Secondary | ICD-10-CM

## 2023-11-08 DIAGNOSIS — Z1151 Encounter for screening for human papillomavirus (HPV): Secondary | ICD-10-CM | POA: Diagnosis not present

## 2023-11-08 MED ORDER — PHEXXI 1.8-1-0.4 % VA GEL: 1.0000 | VAGINAL | 11 refills | Status: AC | PRN

## 2023-11-08 NOTE — Progress Notes (Signed)
WELL-WOMAN EXAMINATION Patient name: Angelica Farmer MRN [id number]  Date of birth: Sep 11, 1986 Chief Complaint:   Gynecologic Exam  History of Present Illness:   Angelica Farmer is a 38 y.o. [phone] female being seen today for a routine well-woman exam.   Today she notes intermittent vaginal odor, thick clear discharge since recent URI. No itching.  Tried Monistat with some improvement.  Denies intermenstrual bleeding.  No other acute complaints  Menses are incredibly regular and so she tracks ovulation to avoid pregnancy.  Denies HMB or dysmenorrhea.   Patient's last menstrual period was 10/28/2023. Denies issues with her menses The current method of family planning is coitus interruptus.   Last pap due today.  Last mammogram: 2023. Last colonoscopy: NA     11/08/2023    8:59 AM 11/08/2023    8:58 AM 03/17/2023    1:37 PM 01/04/2022    2:18 PM  Depression screen PHQ 2/9  Decreased Interest 0 0 0 0  Down, Depressed, Hopeless 0 0 0 0  PHQ - 2 Score 0 0 0 0  Altered sleeping 1     Tired, decreased energy 0     Change in appetite 0     Feeling bad or failure about yourself  0     Trouble concentrating 0     Moving slowly or fidgety/restless 0     Suicidal thoughts 0     PHQ-9 Score 1         Review of Systems:   Pertinent items are noted in HPI Denies any headaches, blurred vision, fatigue, shortness of breath, chest pain, abdominal pain, bowel movements, urination, or intercourse unless otherwise stated above.  Pertinent History Reviewed:  Reviewed past medical,surgical, social and family history.  Reviewed problem list, medications and allergies. Physical Assessment:   Vitals:   11/08/23 0848  BP: 131/86  Pulse: 75  Weight: 177 lb 12.8 oz (80.6 kg)  Height: 5\' 4"  (1.626 m)  Body mass index is 30.52 kg/m.        Physical Examination:   General appearance - well appearing, and in no distress  Mental status - alert, oriented to person,  place, and time  Psych:  She has a normal mood and affect  Skin - warm and dry, normal color, no suspicious lesions noted  Chest - effort normal, all lung fields clear to auscultation bilaterally  Heart - normal rate and regular rhythm  Neck:  midline trachea, no thyromegaly or nodules  Breasts - breasts appear normal, no suspicious masses, no skin or nipple changes or  axillary nodes  Abdomen - soft, nontender, nondistended, no masses or organomegaly  Pelvic - VULVA: normal appearing vulva with no masses, tenderness or lesions  VAGINA: normal appearing vagina with normal color, minimal clumpy white discharge noted, no lesions  CERVIX: normal appearing cervix without discharge or lesions, no CMT  Thin prep pap is done with HR HPV cotesting  UTERUS: uterus is felt to be normal size, shape, consistency and nontender   ADNEXA: No adnexal masses or tenderness noted.  Extremities:  No swelling or varicosities noted  Chaperone: Faith Rogue     Assessment & Plan:  1) Well-Woman Exam -pap collected -reviewed breast Ca screening- plan for next mammogram @ 38yo or sooner should she note any abnormalities  2) Contraceptive -reviewed all contraceptive options including pills, patch, ring, Depot, non-hormonal options and LARCs -risk/benefit and potential side effects of each were reviewed -questions and concerns were addressed, pt desires  to proceed with Phexxi as she would prefer to avoid hormonal intervention  3) Vaginal irritation -await results of pap/vaginitis for treatment  No orders of the defined types were placed in this encounter.   Meds:  Meds ordered this encounter  Medications   Lactic Ac-Citric Ac-Pot Bitart (PHEXXI) 1.8-1-0.4 % GEL    Sig: Place 1 Applicatorful vaginally as needed.    Dispense:  5 g    Refill:  11    Dispense 1 box    Follow-up: Return in about 1 year (around 11/07/2024) for Annual.   Myna Hidalgo, DO Attending Obstetrician & Gynecologist, Faculty  [facility], [facility]

## 2023-11-13 ENCOUNTER — Encounter: Payer: Self-pay | Admitting: Obstetrics & Gynecology

## 2023-11-13 LAB — CYTOLOGY - PAP
Chlamydia: NEGATIVE
Comment: NEGATIVE
Comment: NEGATIVE
Comment: NORMAL
Diagnosis: NEGATIVE
High risk HPV: NEGATIVE
Neisseria Gonorrhea: NEGATIVE

## 2024-02-09 ENCOUNTER — Ambulatory Visit (INDEPENDENT_AMBULATORY_CARE_PROVIDER_SITE_OTHER): Admitting: Family

## 2024-02-09 ENCOUNTER — Encounter: Payer: Self-pay | Admitting: Family

## 2024-02-09 VITALS — BP 126/74 | HR 67 | Temp 97.6°F | Resp 20 | Ht 64.0 in | Wt 173.8 lb

## 2024-02-09 DIAGNOSIS — Z566 Other physical and mental strain related to work: Secondary | ICD-10-CM

## 2024-02-09 DIAGNOSIS — F411 Generalized anxiety disorder: Secondary | ICD-10-CM | POA: Diagnosis not present

## 2024-02-11 NOTE — Progress Notes (Signed)
 Provider: Christean Courts FNP-C  [name], Elijio Guadeloupe, NP  Patient Care Team: Jaquay Posthumus, Elijio Guadeloupe, NP as PCP - General (Family Medicine)  Extended Emergency Contact Information Primary Emergency Contact: [facility] Phone: [phone] Relation: Spouse Preferred language: English Interpreter needed? No Secondary Emergency Contact: Markuson,Lisa Mobile Phone: [phone] Relation: Mother  Code Status:  Full Code  Goals of care: Advanced Directive information    02/09/2024    1:02 PM  Advanced Directives  Does Patient Have a Medical Advance Directive? No  Would patient like information on creating a medical advance directive? No - Patient declined     Chief Complaint  Patient presents with   Medical Management of Chronic Issues    Mental health Discussion.    Discussed the use of AI scribe software for clinical note transcription with the patient, who gave verbal consent to proceed.  History of Present Illness   Angelica Farmer is a 38 year old female who presents with work-related stress and burnout.  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 Her caseload initially was 58 people, reduced to 30, but has since increased back to 58. She is also pursuing a master's degree and has two young children, which adds to her stress. She feels overwhelmed and states, 'I feel like I have nothing else to give.'  She has spoken with her supervisor about the workload, but due to staff shortages, her caseload remains high. She has access to mental health providers and counseling through work but feels that time off would be more beneficial. She has the time available for [facility] but is concerned about returning to an even larger workload.  Previously, she was prescribed sertraline  for stress and took it for about two weeks, noticing a difference. However, she stopped taking it when she started school again. She is  currently not on any medication and feels that time off would be more beneficial. She has discussed her situation with her therapist.  She is in her last semester of her master's program, with exams, papers, and presentations causing additional stress. She is worried about balancing her school responsibilities with her 40-hour work week. She has enough time for six weeks off but feels she only needs two weeks to manage her current stress levels.  No thoughts of suicide and no significant changes in her physical health, although she has lost some weight due to stress and not eating well. She tries to drink water regularly but acknowledges not eating a healthy diet.    Past Medical History:  Diagnosis Date   Hx of abdominoplasty    PCOS (polycystic ovarian syndrome)    Past Surgical History:  Procedure Laterality Date   AUGMENTATION MAMMAPLASTY Bilateral    Implants, 2014   BREAST SURGERY     CESAREAN SECTION      Allergies  Allergen Reactions   Oxycodone Itching    Outpatient Encounter Medications as of 02/09/2024  Medication Sig   amoxicillin-clavulanate (AUGMENTIN) 875-125 MG tablet Take 1 tablet by mouth 2 (two) times daily. (Patient not taking: Reported on 02/09/2024)   Lactic Ac-Citric Ac-Pot Bitart (PHEXXI ) 1.8-1-0.4 % GEL Place 1 Applicatorful vaginally as needed. (Patient not taking: Reported on 02/09/2024)   No facility-administered encounter medications on file as of 02/09/2024.    Review of Systems  Constitutional:  Negative for appetite change, chills, fatigue, fever and unexpected weight change.  HENT:  Negative for congestion, dental problem, ear discharge, ear pain,  facial swelling, hearing loss, nosebleeds, postnasal drip, rhinorrhea, sinus pressure, sinus pain, sneezing, sore throat, tinnitus and trouble swallowing.   Respiratory:  Negative for cough, chest tightness, shortness of breath and wheezing.   Cardiovascular:  Negative for chest pain, palpitations and leg  swelling.  Gastrointestinal:  Negative for abdominal distention, abdominal pain, constipation, diarrhea, nausea and vomiting.  Genitourinary:  Negative for difficulty urinating, dysuria, flank pain, frequency and urgency.  Musculoskeletal:  Negative for arthralgias, back pain, gait problem, joint swelling, myalgias, neck pain and neck stiffness.  Skin:  Negative for color change, pallor, rash and wound.  Neurological:  Negative for dizziness, weakness, light-headedness, numbness and headaches.  Hematological:  Does not bruise/bleed easily.  Psychiatric/Behavioral:  Negative for agitation, behavioral problems, confusion, hallucinations, self-injury, sleep disturbance and suicidal ideas. The patient is not nervous/anxious.        Increased stress level/Burnout      There is no immunization history on file for this patient. Pertinent  Health Maintenance Due  Topic Date Due   INFLUENZA VACCINE  Discontinued      08/31/2022    2:31 PM 09/05/2022    2:42 PM 09/26/2022    1:45 PM 03/17/2023    1:28 PM 02/09/2024    1:00 PM  Fall Risk  Falls in the past year? 0 0 0 0 0  Was there an injury with Fall? 0 0 0 0 0  Fall Risk Category Calculator 0 0 0 0 0  Fall Risk Category (Retired) Low Low Low    (RETIRED) Patient Fall Risk Level Low fall risk Low fall risk Low fall risk    Patient at Risk for Falls Due to No Fall Risks No Fall Risks No Fall Risks No Fall Risks No Fall Risks  Fall risk Follow up Falls evaluation completed Falls evaluation completed Falls evaluation completed Falls evaluation completed Falls evaluation completed   Functional Status Survey:    Vitals:   02/09/24 1303  BP: 126/74  Pulse: 67  Resp: 20  Temp: 97.6 F (36.4 C)  SpO2: 99%  Weight: 173 lb 12.8 oz (78.8 kg)  Height: 5\' 4"  (1.626 m)   Body mass index is 29.83 kg/m. Physical Exam  VITALS: T- 97.6, P- 67, BP- 126/74, SaO2- 99% MEASUREMENTS: Weight- 173.8. GENERAL: Alert, cooperative, well developed, no  acute distress. HEENT: Normocephalic, normal oropharynx, moist mucous membranes. CHEST: Clear to auscultation bilaterally, no wheezes, rhonchi, or crackles. CARDIOVASCULAR: Normal heart rate and rhythm, S1 and S2 normal without murmurs. ABDOMEN: Soft, non-tender, non-distended, without organomegaly, normal bowel sounds. EXTREMITIES: No cyanosis, edema, or swelling. NEUROLOGICAL: Cranial nerves grossly intact, moves all extremities without gross motor or sensory deficit.  SKIN: No rash,no lesion or erythema   PSYCHIATRY/BEHAVIORAL: Anxious,despair     Labs reviewed: No results for input(s): "NA", "K", "CL", "CO2", "GLUCOSE", "BUN", "CREATININE", "CALCIUM", "MG", "PHOS" in the last 8760 hours. No results for input(s): "AST", "ALT", "ALKPHOS", "BILITOT", "PROT", "ALBUMIN" in the last 8760 hours. No results for input(s): "WBC", "NEUTROABS", "HGB", "HCT", "MCV", "PLT" in the last 8760 hours. Lab Results  Component Value Date   TSH 1.70 05/02/2023   No results found for: "HGBA1C" No results found for: "CHOL", "HDL", "LDLCALC", "LDLDIRECT", "TRIG", "CHOLHDL"  Significant Diagnostic Results in last 30 days:  No results found.  Assessment/Plan  Burnout and stress Chronic burnout and stress due to high workload and academic pressures. 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 She has access to mental health resources but feels that time off is necessary for recovery. She plans to take two weeks off to focus on exams and presentations, with a return to work on Feb 26, 2024. - Write a letter for [employer] to request time off from work until Feb 26, 2024. - Encourage taking two weeks off starting February 12, 2024, to focus on exams and presentations. - Advise on stress management techniques, including taking walks and ensuring a healthy diet.  Depression Depression exacerbated by current stressors. She  previously experienced improvement with sertraline  but discontinued due to stress and academic commitments. Currently not on medication and prefers to manage symptoms with time off work. - Consider restarting sertraline  if symptoms persist after time off, starting with a low dose and titrating up as needed. - Continue therapy sessions with current therapist.  Family/ staff Communication: Reviewed plan of care with patient verbalized understanding  Labs/tests ordered: None   Next Appointment: Return if symptoms worsen or fail to improve.   Total time: 25 minutes. Greater than 50% of total time spent doing patient education regarding depressed,Burnout/stress,health maintenance including symptom/medication management.   Estil Heman, NP

## 2024-04-08 ENCOUNTER — Ambulatory Visit

## 2024-08-12 ENCOUNTER — Ambulatory Visit (INDEPENDENT_AMBULATORY_CARE_PROVIDER_SITE_OTHER): Admitting: Obstetrics

## 2024-08-12 ENCOUNTER — Other Ambulatory Visit (HOSPITAL_COMMUNITY)
Admission: RE | Admit: 2024-08-12 | Discharge: 2024-08-12 | Disposition: A | Discharge status: Home / Self Care | Source: Ambulatory Visit | Attending: Obstetrics | Admitting: Obstetrics

## 2024-08-12 ENCOUNTER — Encounter: Payer: Self-pay | Admitting: Obstetrics

## 2024-08-12 VITALS — BP 121/78 | HR 71 | Wt 172.9 lb

## 2024-08-12 DIAGNOSIS — N946 Dysmenorrhea, unspecified: Secondary | ICD-10-CM | POA: Diagnosis not present

## 2024-08-12 DIAGNOSIS — Z3009 Encounter for other general counseling and advice on contraception: Secondary | ICD-10-CM | POA: Insufficient documentation

## 2024-08-12 DIAGNOSIS — Z113 Encounter for screening for infections with a predominantly sexual mode of transmission: Secondary | ICD-10-CM | POA: Diagnosis not present

## 2024-08-12 DIAGNOSIS — Z3169 Encounter for other general counseling and advice on procreation: Secondary | ICD-10-CM

## 2024-08-12 MED ORDER — IBUPROFEN 800 MG PO TABS: 800.0000 mg | ORAL_TABLET | Freq: Three times a day (TID) | ORAL | 5 refills | Status: AC | PRN

## 2024-08-12 MED ORDER — NORETHIN ACE-ETH ESTRAD-FE 1-20 MG-MCG(24) PO TABS: 1.0000 | ORAL_TABLET | Freq: Every day | ORAL | 11 refills | Status: AC

## 2024-08-12 MED ORDER — PNV-DHA+DOCUSATE 27-1.25-300 MG PO CAPS: 1.0000 | ORAL_CAPSULE | Freq: Every day | ORAL | 4 refills | Status: AC

## 2024-08-12 NOTE — Addendum Note (Signed)
 Addended by: MARCINE GAINS on: 08/12/2024 10:34 AM   Modules accepted: Orders

## 2024-08-12 NOTE — Progress Notes (Signed)
 Pt presents for std testing and bc consult. Pt last pap was 10/2023. Pt wants to talk about bc. No other questions or concerns at this time. Pt wants all std testing. Pt period came on 10/16

## 2024-08-12 NOTE — Progress Notes (Signed)
 Subjective:    Angelica Farmer is a 38 y.o. female who presents for contraception counseling. The patient has no complaints today. The patient is sexually active.  Recently engaged.  Wants to conceive one more child after 1 year.  Pertinent past medical history: none.  The information documented in the HPI was reviewed and verified.  Menstrual History: OB History     Gravida  4   Para  4   Term  2   Preterm  2   AB      Living  4      SAB      IAB      Ectopic      Multiple      Live Births  4            Patient's last menstrual period was 08/01/2024 (exact date).   Patient Active Problem List   Diagnosis Date Noted   Abnormal chromosomal and genetic finding on antenatal screening mother 05/29/2020   Antepartum anemia complicating pregnancy 05/29/2020   History of abdominoplasty 05/29/2020   History of placenta previa 05/29/2020   Hx of cesarean section 05/29/2020   Supervision of other high-risk pregnancy 05/29/2020   Past Medical History:  Diagnosis Date   Hx of abdominoplasty    PCOS (polycystic ovarian syndrome)     Past Surgical History:  Procedure Laterality Date   AUGMENTATION MAMMAPLASTY Bilateral    Implants, 2014   BREAST SURGERY     CESAREAN SECTION       Current Outpatient Medications:    amoxicillin-clavulanate (AUGMENTIN) 875-125 MG tablet, Take 1 tablet by mouth 2 (two) times daily. (Patient not taking: Reported on 02/09/2024), Disp: , Rfl:    Lactic Ac-Citric Ac-Pot Bitart (PHEXXI ) 1.8-1-0.4 % GEL, Place 1 Applicatorful vaginally as needed. (Patient not taking: Reported on 08/12/2024), Disp: 5 g, Rfl: 11 Allergies  Allergen Reactions   Oxycodone Itching    Social History   Tobacco Use   Smoking status: Never   Smokeless tobacco: Never  Substance Use Topics   Alcohol use: Yes    Comment: occ    Family History  Problem Relation Age of Onset   Breast cancer Maternal Grandmother    Cancer Maternal Grandmother     Cancer Maternal Grandfather        Review of Systems Constitutional: negative for weight loss Genitourinary:negative for abnormal menstrual periods and vaginal discharge   Objective:   BP 121/78   Pulse 71   Wt 172 lb 14.4 oz (78.4 kg)   LMP 08/01/2024 (Exact Date)   BMI 29.68 kg/m    General:   Alert and no distress  Skin:   no rash or abnormalities  Lungs:   clear to auscultation bilaterally  Heart:   regular rate and rhythm, S1, S2 normal, no murmur, click, rub or gallop  The remainder of the physical exam deferred due to the type of encounter.  Lab Review Urine pregnancy test Labs reviewed yes Radiologic studies reviewed no  I have spent a total of 15 minutes of face-to-face time, excluding clinical staff time, reviewing notes and preparing to see patient, ordering tests and/or medications, and counseling the patient.   Assessment:    38 y.o., starting OCP (estrogen/progesterone), no contraindications.   Plan:   1. Encounter for other general counseling or advice on contraception (Primary) Rx: - Norethindrone Acetate-Ethinyl Estrad-FE (LOESTRIN  24 FE) 1-20 MG-MCG(24) tablet; Take 1 tablet by mouth daily.  Dispense: 28 tablet; Refill: 11  2.  Encounter for preconception consultation - wants to conceive after 1 year of contraception Rx: - Prenat-FeFum-DSS-FA-DHA w/o A (PNV-DHA+DOCUSATE) 27-1.25-300 MG CAPS; Take 1 capsule by mouth daily before breakfast.  Dispense: 90 capsule; Refill: 4  3. Dysmenorrhea Rx: - ibuprofen (ADVIL) 800 MG tablet; Take 1 tablet (800 mg total) by mouth every 8 (eight) hours as needed.  Dispense: 30 tablet; Refill: 5     All questions answered. Contraception: OCP (estrogen/progesterone). Discussed healthy lifestyle modifications. Follow up in 1 year. Pregnancy test, result: negative.  CARLIN RONAL CENTERS, MD, FACOG Attending Obstetrician & Gynecologist, [facility] for [facility], [facility],  [place] 08/12/2024

## 2024-08-13 ENCOUNTER — Encounter: Payer: Self-pay | Admitting: Obstetrics

## 2024-08-13 ENCOUNTER — Ambulatory Visit: Payer: Self-pay | Admitting: Obstetrics

## 2024-08-13 DIAGNOSIS — B3731 Acute candidiasis of vulva and vagina: Secondary | ICD-10-CM

## 2024-08-13 LAB — CERVICOVAGINAL ANCILLARY ONLY
Bacterial Vaginitis (gardnerella): NEGATIVE
Candida Glabrata: NEGATIVE
Candida Vaginitis: POSITIVE — AB
Chlamydia: NEGATIVE
Comment: NEGATIVE
Comment: NEGATIVE
Comment: NEGATIVE
Comment: NEGATIVE
Comment: NEGATIVE
Comment: NORMAL
Neisseria Gonorrhea: NEGATIVE
Trichomonas: NEGATIVE

## 2024-08-13 MED ORDER — FLUCONAZOLE 150 MG PO TABS: 150.0000 mg | ORAL_TABLET | Freq: Once | ORAL | 0 refills | Status: AC

## 2024-08-20 ENCOUNTER — Other Ambulatory Visit: Payer: Self-pay

## 2024-08-20 MED ORDER — FLUCONAZOLE 150 MG PO TABS: 150.0000 mg | ORAL_TABLET | Freq: Once | ORAL | 0 refills | Status: AC

## 2024-10-31 ENCOUNTER — Encounter: Admitting: Family

## 2024-11-11 NOTE — Progress Notes (Signed)
   This encounter was created in error - please disregard. No show
